# Patient Record
Sex: Male | Born: 1935 | Race: White | Hispanic: No | Smoking: Former smoker
Health system: Southern US, Community
[De-identification: ages and names within clinical notes are randomized; demographics above are authoritative.]

## PROBLEM LIST (undated history)

## (undated) DIAGNOSIS — N185 Chronic kidney disease, stage 5: Secondary | ICD-10-CM

## (undated) DIAGNOSIS — N189 Chronic kidney disease, unspecified: Secondary | ICD-10-CM

## (undated) DIAGNOSIS — D631 Anemia in chronic kidney disease: Secondary | ICD-10-CM

## (undated) DIAGNOSIS — E119 Type 2 diabetes mellitus without complications: Secondary | ICD-10-CM

## (undated) DIAGNOSIS — I10 Essential (primary) hypertension: Secondary | ICD-10-CM

## (undated) HISTORY — PX: APPENDECTOMY: SHX54

## (undated) HISTORY — PX: EYE SURGERY: SHX253

## (undated) HISTORY — PX: TONSILLECTOMY: SUR1361

## (undated) HISTORY — PX: COLON SURGERY: SHX602

## (undated) HISTORY — PX: DIAGNOSTIC LAPAROSCOPY: SUR761

---

## 2012-03-29 ENCOUNTER — Inpatient Hospital Stay (HOSPITAL_COMMUNITY)
Admission: AD | Admit: 2012-03-29 | Discharge: 2012-04-02 | DRG: 690 | Disposition: A | Discharge status: Home / Self Care | Payer: Medicare (Managed Care) | Source: Ambulatory Visit | Attending: Family Medicine | Admitting: Family Medicine

## 2012-03-29 ENCOUNTER — Encounter (HOSPITAL_COMMUNITY): Payer: Self-pay | Admitting: *Deleted

## 2012-03-29 ENCOUNTER — Ambulatory Visit: Payer: Medicare Other

## 2012-03-29 ENCOUNTER — Ambulatory Visit (INDEPENDENT_AMBULATORY_CARE_PROVIDER_SITE_OTHER): Payer: Medicare Other | Admitting: Family Medicine

## 2012-03-29 VITALS — BP 112/78 | HR 67 | Temp 98.4°F | Resp 18 | Ht 68.75 in | Wt 138.0 lb

## 2012-03-29 DIAGNOSIS — R739 Hyperglycemia, unspecified: Secondary | ICD-10-CM | POA: Diagnosis present

## 2012-03-29 DIAGNOSIS — R634 Abnormal weight loss: Secondary | ICD-10-CM

## 2012-03-29 DIAGNOSIS — IMO0002 Reserved for concepts with insufficient information to code with codable children: Secondary | ICD-10-CM

## 2012-03-29 DIAGNOSIS — N189 Chronic kidney disease, unspecified: Secondary | ICD-10-CM

## 2012-03-29 DIAGNOSIS — N400 Enlarged prostate without lower urinary tract symptoms: Secondary | ICD-10-CM | POA: Diagnosis present

## 2012-03-29 DIAGNOSIS — E441 Mild protein-calorie malnutrition: Secondary | ICD-10-CM | POA: Diagnosis present

## 2012-03-29 DIAGNOSIS — N10 Acute tubulo-interstitial nephritis: Principal | ICD-10-CM | POA: Diagnosis present

## 2012-03-29 DIAGNOSIS — E119 Type 2 diabetes mellitus without complications: Secondary | ICD-10-CM | POA: Insufficient documentation

## 2012-03-29 DIAGNOSIS — I120 Hypertensive chronic kidney disease with stage 5 chronic kidney disease or end stage renal disease: Secondary | ICD-10-CM | POA: Diagnosis present

## 2012-03-29 DIAGNOSIS — E440 Moderate protein-calorie malnutrition: Secondary | ICD-10-CM | POA: Diagnosis present

## 2012-03-29 DIAGNOSIS — R630 Anorexia: Secondary | ICD-10-CM | POA: Diagnosis present

## 2012-03-29 DIAGNOSIS — Z23 Encounter for immunization: Secondary | ICD-10-CM

## 2012-03-29 DIAGNOSIS — E860 Dehydration: Secondary | ICD-10-CM | POA: Diagnosis present

## 2012-03-29 DIAGNOSIS — K219 Gastro-esophageal reflux disease without esophagitis: Secondary | ICD-10-CM | POA: Diagnosis present

## 2012-03-29 DIAGNOSIS — Z79899 Other long term (current) drug therapy: Secondary | ICD-10-CM

## 2012-03-29 DIAGNOSIS — N185 Chronic kidney disease, stage 5: Secondary | ICD-10-CM | POA: Diagnosis present

## 2012-03-29 DIAGNOSIS — Z66 Do not resuscitate: Secondary | ICD-10-CM | POA: Diagnosis present

## 2012-03-29 DIAGNOSIS — D631 Anemia in chronic kidney disease: Secondary | ICD-10-CM | POA: Diagnosis present

## 2012-03-29 DIAGNOSIS — B9689 Other specified bacterial agents as the cause of diseases classified elsewhere: Secondary | ICD-10-CM | POA: Diagnosis present

## 2012-03-29 DIAGNOSIS — K5900 Constipation, unspecified: Secondary | ICD-10-CM | POA: Diagnosis present

## 2012-03-29 DIAGNOSIS — R82998 Other abnormal findings in urine: Secondary | ICD-10-CM

## 2012-03-29 DIAGNOSIS — A499 Bacterial infection, unspecified: Secondary | ICD-10-CM

## 2012-03-29 DIAGNOSIS — D72829 Elevated white blood cell count, unspecified: Secondary | ICD-10-CM | POA: Diagnosis present

## 2012-03-29 DIAGNOSIS — D696 Thrombocytopenia, unspecified: Secondary | ICD-10-CM | POA: Diagnosis present

## 2012-03-29 DIAGNOSIS — R8281 Pyuria: Secondary | ICD-10-CM

## 2012-03-29 DIAGNOSIS — N179 Acute kidney failure, unspecified: Secondary | ICD-10-CM | POA: Diagnosis present

## 2012-03-29 HISTORY — DX: Chronic kidney disease, unspecified: N18.9

## 2012-03-29 HISTORY — DX: Chronic kidney disease, stage 5: N18.5

## 2012-03-29 HISTORY — DX: Type 2 diabetes mellitus without complications: E11.9

## 2012-03-29 LAB — GLUCOSE, CAPILLARY: Glucose-Capillary: 540 mg/dL — ABNORMAL HIGH (ref 70–99)

## 2012-03-29 LAB — COMPREHENSIVE METABOLIC PANEL
ALT: 10 U/L (ref 0–53)
ALT: 7 U/L (ref 0–53)
AST: 7 U/L (ref 0–37)
Alkaline Phosphatase: 110 U/L (ref 39–117)
CO2: 20 mEq/L (ref 19–32)
CO2: 22 mEq/L (ref 19–32)
Calcium: 8.6 mg/dL (ref 8.4–10.5)
Chloride: 92 mEq/L — ABNORMAL LOW (ref 96–112)
Creat: 5.26 mg/dL — ABNORMAL HIGH (ref 0.50–1.35)
GFR calc Af Amer: 11 mL/min — ABNORMAL LOW (ref >90)
GFR calc non Af Amer: 9 mL/min — ABNORMAL LOW (ref >90)
Glucose, Bld: 531 mg/dL (ref 70–99)
Glucose, Bld: 486 mg/dL — ABNORMAL HIGH (ref 70–99)
Potassium: 4.4 mEq/L (ref 3.5–5.1)
Sodium: 130 mEq/L — ABNORMAL LOW (ref 135–145)
Total Protein: 6.1 g/dL (ref 6.0–8.3)

## 2012-03-29 LAB — POCT CBC
Lymph, poc: 1.3 (ref 0.6–3.4)
MCH, POC: 29.8 pg (ref 27–31.2)
MCHC: 31.9 g/dL (ref 31.8–35.4)
MID (cbc): 0.8 (ref 0–0.9)
MPV: 9.5 fL (ref 0–99.8)
POC Granulocyte: 23.5 — AB (ref 2–6.9)
POC LYMPH PERCENT: 4.9 %L — AB (ref 10–50)
POC MID %: 3.3 %M (ref 0–12)
RDW, POC: 14.3 %
WBC: 25.6 10*3/uL — AB (ref 4.6–10.2)

## 2012-03-29 LAB — CBC WITH DIFFERENTIAL/PLATELET
HCT: 30.6 % — ABNORMAL LOW (ref 39.0–52.0)
Hemoglobin: 10.5 g/dL — ABNORMAL LOW (ref 13.0–17.0)
Lymphocytes Relative: 3 % — ABNORMAL LOW (ref 12–46)
Lymphs Abs: 0.6 10*3/uL — ABNORMAL LOW (ref 0.7–4.0)
MCHC: 34.3 g/dL (ref 30.0–36.0)
Monocytes Absolute: 1 10*3/uL (ref 0.1–1.0)
Monocytes Relative: 5 % (ref 3–12)
Neutro Abs: 18.3 10*3/uL — ABNORMAL HIGH (ref 1.7–7.7)
Neutrophils Relative %: 92 % — ABNORMAL HIGH (ref 43–77)
RBC: 3.48 MIL/uL — ABNORMAL LOW (ref 4.22–5.81)
WBC: 20 10*3/uL — ABNORMAL HIGH (ref 4.0–10.5)

## 2012-03-29 LAB — POCT UA - MICROSCOPIC ONLY
Mucus, UA: NEGATIVE
Yeast, UA: NEGATIVE

## 2012-03-29 LAB — POCT URINALYSIS DIPSTICK
Bilirubin, UA: NEGATIVE
Glucose, UA: >=1000
Ketones, UA: NEGATIVE
Protein, UA: 30

## 2012-03-29 LAB — TECHNOLOGIST SMEAR REVIEW

## 2012-03-29 LAB — SEDIMENTATION RATE: Sed Rate: 30 mm/hr — ABNORMAL HIGH (ref 0–16)

## 2012-03-29 LAB — TSH: TSH: 2.081 u[IU]/mL (ref 0.350–4.500)

## 2012-03-29 LAB — GLUCOSE, POCT (MANUAL RESULT ENTRY)

## 2012-03-29 MED ORDER — INSULIN ASPART 100 UNIT/ML ~~LOC~~ SOLN
12.0000 [IU] | Freq: Once | SUBCUTANEOUS | Status: AC
  Administered 2012-03-29: 12 [IU] via SUBCUTANEOUS

## 2012-03-29 MED ORDER — BUPROPION HCL ER (SR) 150 MG PO TB12
150.0000 mg | ORAL_TABLET | Freq: Two times a day (BID) | ORAL | Status: DC
  Administered 2012-03-29 – 2012-03-30 (×2): 150 mg via ORAL
  Filled 2012-03-29 (×3): qty 1

## 2012-03-29 MED ORDER — INSULIN ASPART 100 UNIT/ML ~~LOC~~ SOLN
8.0000 [IU] | Freq: Once | SUBCUTANEOUS | Status: AC
  Administered 2012-03-29: 8 [IU] via SUBCUTANEOUS

## 2012-03-29 MED ORDER — HEPARIN SODIUM (PORCINE) 5000 UNIT/ML IJ SOLN
5000.0000 [IU] | Freq: Three times a day (TID) | INTRAMUSCULAR | Status: DC
  Administered 2012-03-30 – 2012-04-01 (×8): 5000 [IU] via SUBCUTANEOUS
  Filled 2012-03-29 (×14): qty 1

## 2012-03-29 MED ORDER — METOPROLOL TARTRATE 25 MG PO TABS
25.0000 mg | ORAL_TABLET | Freq: Two times a day (BID) | ORAL | Status: DC
  Administered 2012-03-29: 25 mg via ORAL
  Filled 2012-03-29 (×3): qty 1

## 2012-03-29 MED ORDER — PANTOPRAZOLE SODIUM 40 MG PO TBEC
40.0000 mg | DELAYED_RELEASE_TABLET | Freq: Every day | ORAL | Status: DC
  Administered 2012-03-30 – 2012-04-02 (×4): 40 mg via ORAL
  Filled 2012-03-29 (×4): qty 1

## 2012-03-29 MED ORDER — DOXAZOSIN MESYLATE 2 MG PO TABS
2.0000 mg | ORAL_TABLET | Freq: Every day | ORAL | Status: DC
  Administered 2012-03-29: 2 mg via ORAL
  Filled 2012-03-29 (×2): qty 1

## 2012-03-29 MED ORDER — ASPIRIN 81 MG PO CHEW
81.0000 mg | CHEWABLE_TABLET | Freq: Every day | ORAL | Status: DC
  Administered 2012-03-29 – 2012-03-31 (×3): 81 mg via ORAL
  Filled 2012-03-29 (×4): qty 1

## 2012-03-29 MED ORDER — SIMVASTATIN 10 MG PO TABS
10.0000 mg | ORAL_TABLET | Freq: Every day | ORAL | Status: DC
  Administered 2012-03-29 – 2012-03-31 (×2): 10 mg via ORAL
  Filled 2012-03-29 (×4): qty 1

## 2012-03-29 MED ORDER — INSULIN ASPART 100 UNIT/ML ~~LOC~~ SOLN
0.0000 [IU] | Freq: Three times a day (TID) | SUBCUTANEOUS | Status: DC
  Administered 2012-03-30: 2 [IU] via SUBCUTANEOUS
  Administered 2012-03-30: 9 [IU] via SUBCUTANEOUS
  Administered 2012-03-30: 5 [IU] via SUBCUTANEOUS

## 2012-03-29 MED ORDER — INFLUENZA VIRUS VACC SPLIT PF IM SUSP
0.5000 mL | INTRAMUSCULAR | Status: AC
  Administered 2012-03-31: 0.5 mL via INTRAMUSCULAR
  Filled 2012-03-29: qty 0.5

## 2012-03-29 NOTE — Progress Notes (Signed)
[facility] [address], [place] [address] [phone]  Date:  03/29/2012   Name:  Mario Turner   DOB:  [birth year]   MRN:  [id number]  PCP:  No primary provider on file.    Chief Complaint: Fatigue and no appetite   History of Present Illness:  Mario Turner is a 78 y.o. very pleasant male patient who presents with the following:  Here today with his son.  He lives in [place] full- time, but has been traveling- driving across the [place]-  for the last 5 weeks.  Some of his friends called his son recently with their concerns that Mario Turner is weak, sleeping a lot and not eating much.  His son went out to pick him up from [place] because he did not feel like driving any further.  He just arrived in [place] yesterday.  He has been feeling poorly for about one month, even worse for about 2 weeks.  He has fallen a couple of times about a week ago -this is not typical for him.  He did not have LOC.  He banged his right knee the last time he fell but was not serious injured.    He has been told that he has kidney failure- about 5 years ago he was told that he needed dialysis.  He thinks that his BUN might be around 25 on last check but he is not really sure. His PCP is in [place].  He also has DM- no insulin.  Review of his glucose shows that he normally runs between 75 and 150.  He does not usually get over 200.      He does have a history of high potassium as well.    He does not note any other particular symptoms such as fever, chills, diarrhea, or vomiting.  He does not have any localized pain. He does not feel like eating much.  He does not have a taste for food. "If I never eat again that is ok."  He has lost 5- 10 lbs.    He has not noted any CP or SOB.  Demarquez has noted some constipation- he goes about once a week as of late.  He does not seem to have any urinary symptoms except that "sometimew when I go there isn't anything there."    There is no problem list  on file for this patient.   No past medical history on file.  No past surgical history on file.  History  Substance Use Topics  . Smoking status: Former Smoker    Quit date: 06/23/1978  . Smokeless tobacco: Not on file  . Alcohol Use: Not on file    No family history on file.  Not on File  Medication list has been reviewed and updated.  Current Outpatient Prescriptions on File Prior to Visit  Medication Sig Dispense Refill  . buPROPion (BUDEPRION SR) 150 MG 12 hr tablet Take 150 mg by mouth 2 (two) times daily.      Marland Kitchen doxazosin (CARDURA) 2 MG tablet Take 2 mg by mouth at bedtime.      Marland Kitchen glipiZIDE (GLUCOTROL) 10 MG tablet Take 10 mg by mouth 2 (two) times daily before a meal. 2 in the morning and 1 in the evening      . lisinopril (PRINIVIL,ZESTRIL) 10 MG tablet Take 10 mg by mouth daily.      . metoprolol tartrate (LOPRESSOR) 25 MG tablet Take 25 mg by mouth 2 (  two) times daily.      . simvastatin (ZOCOR) 10 MG tablet Take 10 mg by mouth at bedtime.      . sitaGLIPtin (JANUVIA) 25 MG tablet Take 25 mg by mouth daily.        Review of Systems:  As per HPI- otherwise negative.   Physical Examination: Filed Vitals:   03/29/12 1441  BP: 112/78  Pulse: 67  Temp: 98.4 F (36.9 C)  Resp: 18   Filed Vitals:   03/29/12 1441  Height: 5' 8.75" (1.746 m)  Weight: 138 lb (62.596 kg)   Body mass index is 20.53 kg/(m^2). Ideal Body Weight: Weight in (lb) to have BMI = 25: 167.7   GEN: WDWN, NAD, Non-toxic, A & O x 3, looks well but frail HEENT: Atraumatic, Normocephalic. Neck supple. No masses, No LAD.  Bilateral TM wnl, oropharynx normal.  PEERL,EOMI.   Ears and Nose: No external deformity. CV: RRR, No M/G/R. No JVD. No thrill. No extra heart sounds. PULM: CTA B, no wheezes, crackles, rhonchi. No retractions. No resp. distress. No accessory muscle use. ABD: S, NT, ND, +BS. No rebound. No HSM. Abdomen is benign.   EXTR: No c/c/e NEURO Normal gait.  PSYCH: Normally  interactive. Conversant. Not depressed or anxious appearing.  Calm demeanor.   [facility] reading (PRIMARY) by  Dr. Patsy Lager.  CXR: negative, degenerative change in the spine Abdominal series: increased bowel gas, no obstruction  ACUTE ABDOMEN SERIES (ABDOMEN 2 VIEW & CHEST 1 VIEW)  Comparison: None.  Findings: Mildly prominent loops of small bowel on the supine view, but no air-fluid levels on the erect view. This is nonspecific. It may reflect a minor adynamic ileus, or even be a normal finding in this patient. There is no evidence of obstruction and no free air.  The abdominal soft tissues are unremarkable.  The included chest radiograph demonstrates he healed granuloma in the upper lobes but otherwise clear lungs and in unremarkable heart, mediastinum and hila.  The bones are demineralized and there are degenerative changes most evident of the lumbar spine. No osteoblastic or osteolytic lesions.  IMPRESSION: No obstruction or free air. No active disease of the chest radiograph.  CHEST - 2 VIEW  Comparison: None.  Findings: Lungs are hyperexpanded. No edema or focal airspace consolidation. Small nodular densities are seen in the right mid lung and in the left apex. Given the conspicuity relative to their tiny size, they are most likely mineralized and represent calcified granulomata. The cardiopericardial silhouette is within normal limits for size. Imaged bony structures of the thorax are intact.  IMPRESSION: No acute cardiopulmonary process.   Results for orders placed in visit on 03/29/12  POCT CBC      Component Value Range   WBC 25.6 (*) 4.6 - 10.2 K/uL   Lymph, poc 1.3  0.6 - 3.4   POC LYMPH PERCENT 4.9 (*) 10 - 50 %L   MID (cbc) 0.8  0 - 0.9   POC MID % 3.3  0 - 12 %M   POC Granulocyte 23.5 (*) 2 - 6.9   Granulocyte percent 91.8 (*) 37 - 80 %G   RBC 3.92 (*) 4.69 - 6.13 M/uL   Hemoglobin 11.7 (*) 14.1 - 18.1 g/dL   HCT, POC 40.9 (*) 81.1 - 53.7 %   MCV 93.6   80 - 97 fL   MCH, POC 29.8  27 - 31.2 pg   MCHC 31.9  31.8 - 35.4 g/dL   RDW, POC 14.3  Platelet Count, POC 620 (*) 142 - 424 K/uL   MPV 9.5  0 - 99.8 fL  GLUCOSE, POCT (MANUAL RESULT ENTRY)      Component Value Range   POC Glucose HHH  70 - 99 mg/dl  POCT GLYCOSYLATED HEMOGLOBIN (HGB A1C)      Component Value Range   Hemoglobin A1C 8.6    POCT URINALYSIS DIPSTICK      Component Value Range   Color, UA yellow     Clarity, UA cloudy     Glucose, UA >=1000     Bilirubin, UA mneg     Ketones, UA neg     Spec Grav, UA <=1.005     Blood, UA large     pH, UA 5.0     Protein, UA 30     Urobilinogen, UA 0.2     Nitrite, UA positive     Leukocytes, UA small (1+)    POCT UA - MICROSCOPIC ONLY      Component Value Range   WBC, Ur, HPF, POC 20-25     RBC, urine, microscopic 3-5     Bacteria, U Microscopic trace     Mucus, UA neg     Epithelial cells, urine per micros 0-1     Crystals, Ur, HPF, POC neg     Casts, Ur, LPF, POC neg     Yeast, UA neg      Assessment and Plan: 1. Anorexia  Comprehensive metabolic panel  2. Diabetes mellitus, type II  POCT glucose (manual entry), POCT glycosylated hemoglobin (Hb A1C), Comprehensive metabolic panel  3. CRF (chronic renal failure)  Comprehensive metabolic panel  4. Weight loss  POCT CBC, POCT urinalysis dipstick, POCT UA - Microscopic Only, DG Abd Acute W/Chest, DG Chest 2 View, Comprehensive metabolic panel, TSH  5. Pyuria  Urine culture   Mario Turner may have pyelonephritis.  However, I am not certain that his urine is his infection source. We have sent a urine culture.  I canceled the above CMP.  We are not able to read his glucose- it is unusual for his sugar to be so high.   Mario Turner has several concerning issues on his labs today, and I am not able to check a CMET or calculate his gap here today.  He needs hospital admission for further evaluation and treatment.  Discussed with FP inpatient team who agreed to admit this nice gentleman.   We appreciate their care of Mr. Crookshanks.  He will proceed to admitting with his son.    Abbe Amsterdam, MD

## 2012-03-29 NOTE — H&P (Signed)
Family Medicine Teaching [facility] Admission History and Physical Service Pager: [phone]  Patient name: Mario Turner Medical record number: [id number] Date of birth: [birth year] Age: 78 y.o. Gender: male  Primary Care Provider: [facility] x 1 visit, [place]  Chief Complaint: fatigue, sleepiness  Assessment and Plan: Mario Turner is a 78 y.o. year old male  Presenting as direct afmit from [place] with fatigue, sleepiness and found to have wbc >25k and hyperglycemia  1. Leukocytosis-unclear etiology, concern of relation to malignancy or dysplastic process (obtain peripheral smear), could be related to DKA but no ketones in urine and pending cmet, could be secondary to infection - negative CXR, possible UTI, no other clues on history or exam.  1.  pending cbc with diff, LDH, ESR 2. Will not empirically treat with antibiotics given clinical stability and lack of fevers 1. Follow urine culture 2. Consider blood cultures if febrile 2. Hyperglycemia-once again uncertain etiology, patient exogenous  insulin naive and takes glipizide and [place]. Could be related to noncompliance but reportedly does not miss many doses.  1. Will hold orals inpatient and place on sliding scale 2. 12 units now and repeat in 2 hours for cbg of 540 3. CMET and calculate GAP 3. Weight loss/fatigue/sleepiness-will check AM cortisol (possible suppressed adrenal axis) and TSH in addition to above testing. Possible #4 contributing.  4. Anemia-thrombocytosis as well, could be acute phase reactant. Will check FOBT. Normal hgb makes an acute leukemia less likely as does elevated platelets.  5. CKD history-will hold on sodium bicarb for now, could effect anion gap readings 6. CV (HTN, HLD)-continue aspirin, beta blocker. Hold lisinopril while awaiting CMET. Continue statin 7. GERD-continue PPI, well controlled 8. BPH-continue doxazosin 9. Depression-cotinue bupropion 10.  11. FEN/GI: carb modified,  check cmet 12. Prophylaxis: heparin SQ 13. Disposition: observation, pendin further workup, consider PT/OT 14. Code Status: dnr/dni/no dialysis  History of Present Illness: Mario Turner is a 78 y.o. year old male presenting with increasing  fatigue, sleepiness over last 2 months.   Patient on road trip/vacationing for last 1.5 months and has noted progressive low appetite as "food just didn't taste good". Patient has lost 7 lbs in month and a half. No diarrhea but in fact he has had bad constipation (coudlnt go for 2 weeks and now just goes every other week). Does report having colonoscopy in last 10 years. He and his son who is present with him also describe more confusion during this time. He will forget some things such as taking his medicine but he remains fully oriented and able to do most tasks.   The patient describes that his wife died 3 years ago and "came back" and "told him how good things were going". Wife told him to go help family friend in [place] while on his road trip who ended up several weeks later having a son with MS with multiple problems who needed help. Son comments that this story has not changed for his father and that his dad has repeated story very similarly with slight variation from time to time. When patient had a lot of things to do with his friend whose son had MS, he felt somewhat energized but when he moved on to visit another friend, he noticed that as he was less busy, his tiredness and weakness progressed. Most recently within the last 2 weeks he has been visiting friends in [place] and has had to use steps to go to his room. This has  been very taxing on him more than expected.  He has had 2 falls out of a bathtub and people he was staying with called patient's son saying very sleepy and eating minimal amounts. When fell, did not lose consciousness or hit head, only hit knee. Went to [facility] today and found to have wbc >25k and hyperglycemia so sent to  [facility] as direct admit.   ROS shows -Urinating more frequently for last 6 weeks. Has not checked CBGs since 9/20. No dysuria. Denies URI symptoms, cough, sob, sore throat, chest pain, lymphadenopathy, fevers, chills, night sweats, nausea, vomiting, diarrhea, abdominal pain, rash, sores, ear pain.   Patient made sure during conversation to note that he has a living will-body donated for surgical in [facility] [place] to test new surgical equipment. DNR/DNI. No dialysis.   Patient Active Problem List  Diagnosis  . Diabetes mellitus, type II  . CRF (chronic renal failure)  HLD HTN Poor kidney function would not desire dialysis. -was told   Past Medical History: No past medical history on file. Past Surgical History: Appendectomy, rectal surgery for unknown reasons  Social History: History  Substance Use Topics  . Smoking status: Former Smoker    Quit date: 06/23/1978  . Smokeless tobacco: Not on file  . Alcohol Use: Not on file   For any additional social history documentation, please refer to relevant sections of EMR.  Family History: No family history on file. Parents in great health with exception of dementai Allergies: No Known Allergies No current facility-administered medications on file prior to encounter.   Current Outpatient Prescriptions on File Prior to Encounter  Medication Sig Dispense Refill  . buPROPion (BUDEPRION SR) 150 MG 12 hr tablet Take 150 mg by mouth 2 (two) times daily.      Marland Kitchen doxazosin (CARDURA) 2 MG tablet Take 2 mg by mouth at bedtime.      Marland Kitchen glipiZIDE (GLUCOTROL) 10 MG tablet Take 10 mg by mouth 2 (two) times daily before a meal. 2 in the morning and 1 in the evening      . lisinopril (PRINIVIL,ZESTRIL) 10 MG tablet Take 10 mg by mouth daily.      . metoprolol tartrate (LOPRESSOR) 25 MG tablet Take 25 mg by mouth 2 (two) times daily.      . simvastatin (ZOCOR) 10 MG tablet Take 10 mg by mouth at bedtime.      . sitaGLIPtin (JANUVIA) 25 MG tablet Take 25  mg by mouth daily.      . sodium bicarbonate 325 MG tablet Take 150 mg by mouth 2 (two) times daily.       Review Of Systems: Per HPI  Otherwise 12 point review of systems was performed and was unremarkable.  Physical Exam: BP 115/81  Pulse 71  Temp 98.2 F (36.8 C) (Oral)  Resp 18  Ht 5' 8.9" (1.75 m)  Wt 138 lb 0.1 oz (62.6 kg)  BMI 20.44 kg/m2  SpO2 99% Gen: NAD, resting comfortably in bed, very thin male, no difficulty getting out of bed and walking to bathroom HEENT: NCAT,  Slightly dry mucus membranes, PERRLA  CV: RRR no mrg  Lungs: CTAB  Abd: soft/nondistended/normal bowel sounds. Liver edge felt 2 cm below costal margin. No splenomegaly. Mildly tender to deep palpation in all quadrants.  MSK: moves all extremities, no edema  Skin: warm and dry, no rash  Lymph nodes: no axillary, inguinal, cervical lymphadenopathy Neuro: alert and oriented x 3,  sensation and reflexes normal  throughout, 5/5 muscle strength in bilateral upper and lower extremities.    Labs and Imaging: Results for orders placed during the hospital encounter of 03/29/12 (from the past 48 hour(s))  GLUCOSE, CAPILLARY     Status: Abnormal   Collection Time   03/29/12  7:27 PM      Component Value Range Comment   Glucose-Capillary 540 (*) 70 - 99 mg/dL    Comment 1 Call MD NNP PA CNM      Other labs pending.   Outpatient POC cbc with wbc 25.6, hgb 11.7, plat 620 a1c 8.6 but CBG too high to count UA >1000 glucose, <1.005 spec grav, negative ketones, nitrite positive, small leukocytes, 20-25 wbc, 3-5 rbc, trace bacteria, few epithelial cells  Dg Chest 2 View  03/29/2012  *RADIOLOGY REPORT*  Clinical Data: Chest pain and shortness of breath.  CHEST - 2 VIEW  Comparison: None.  Findings: Lungs are hyperexpanded.  No edema or focal airspace consolidation.  Small nodular densities are seen in the right mid lung and in the left apex.  Given the conspicuity relative to their tiny size, they are most likely  mineralized and represent calcified granulomata. The cardiopericardial silhouette is within normal limits for size. Imaged bony structures of the thorax are intact.  IMPRESSION: No acute cardiopulmonary process.   Original Report Authenticated By: ERIC A. MANSELL, M.D.    Dg Abd Acute W/chest  03/29/2012  *RADIOLOGY REPORT*  Clinical Data: Abdominal discomfort.  Weight loss.  ACUTE ABDOMEN SERIES (ABDOMEN 2 VIEW & CHEST 1 VIEW)  Comparison: None.  Findings: Mildly prominent loops of small bowel on the supine view, but no air-fluid levels on the erect view.  This is nonspecific. It may reflect a minor adynamic ileus, or even be a normal finding in this patient.  There is no evidence of obstruction and no free air.  The abdominal soft tissues are unremarkable.  The included chest radiograph demonstrates he healed granuloma in the upper lobes but otherwise clear lungs and in unremarkable heart, mediastinum and hila.  The bones are demineralized and there are degenerative changes most evident of the lumbar spine.  No osteoblastic or osteolytic lesions.  IMPRESSION: No obstruction or free air.  No active disease of the chest radiograph.   Original Report Authenticated By: Domenic Moras, M.D.    Tana Conch, MD, PGY2 03/29/2012 8:47 PM

## 2012-03-29 NOTE — Patient Instructions (Addendum)
Please go to admitting at [facility]- you are going to be admitted to the [facility] service.  You do not have to go to the Emergency Room

## 2012-03-30 ENCOUNTER — Observation Stay (HOSPITAL_COMMUNITY): Payer: Medicare (Managed Care)

## 2012-03-30 ENCOUNTER — Encounter: Payer: Self-pay | Admitting: Family Medicine

## 2012-03-30 DIAGNOSIS — N10 Acute tubulo-interstitial nephritis: Secondary | ICD-10-CM

## 2012-03-30 DIAGNOSIS — N186 End stage renal disease: Secondary | ICD-10-CM

## 2012-03-30 DIAGNOSIS — N179 Acute kidney failure, unspecified: Secondary | ICD-10-CM

## 2012-03-30 LAB — COMPREHENSIVE METABOLIC PANEL
ALT: 8 U/L (ref 0–53)
CO2: 23 mEq/L (ref 19–32)
Calcium: 8.3 mg/dL — ABNORMAL LOW (ref 8.4–10.5)
Chloride: 98 mEq/L (ref 96–112)
GFR calc Af Amer: 11 mL/min — ABNORMAL LOW (ref >90)
GFR calc non Af Amer: 10 mL/min — ABNORMAL LOW (ref >90)
Glucose, Bld: 186 mg/dL — ABNORMAL HIGH (ref 70–99)
Sodium: 131 mEq/L — ABNORMAL LOW (ref 135–145)
Total Bilirubin: 0.3 mg/dL (ref 0.3–1.2)

## 2012-03-30 LAB — BASIC METABOLIC PANEL
BUN: 88 mg/dL — ABNORMAL HIGH (ref 6–23)
Creatinine, Ser: 5.02 mg/dL — ABNORMAL HIGH (ref 0.50–1.35)
GFR calc Af Amer: 11 mL/min — ABNORMAL LOW (ref >90)
GFR calc non Af Amer: 10 mL/min — ABNORMAL LOW (ref >90)
Glucose, Bld: 428 mg/dL — ABNORMAL HIGH (ref 70–99)
Potassium: 4.5 mEq/L (ref 3.5–5.1)

## 2012-03-30 LAB — RETICULOCYTES
RBC.: 3.09 MIL/uL — ABNORMAL LOW (ref 4.22–5.81)
Retic Count, Absolute: 27.8 10*3/uL (ref 19.0–186.0)
Retic Ct Pct: 0.9 % (ref 0.4–3.1)

## 2012-03-30 LAB — CBC
Hemoglobin: 9.5 g/dL — ABNORMAL LOW (ref 13.0–17.0)
MCH: 30.4 pg (ref 26.0–34.0)
MCHC: 34.7 g/dL (ref 30.0–36.0)
Platelets: 364 10*3/uL (ref 150–400)
RDW: 13.2 % (ref 11.5–15.5)

## 2012-03-30 LAB — GLUCOSE, CAPILLARY

## 2012-03-30 LAB — CORTISOL-AM, BLOOD: Cortisol - AM: 22.4 ug/dL (ref 4.3–22.4)

## 2012-03-30 MED ORDER — SODIUM CHLORIDE 0.9 % IV SOLN
INTRAVENOUS | Status: DC
  Administered 2012-03-30 (×2): via INTRAVENOUS
  Administered 2012-03-31: 20 mL via INTRAVENOUS
  Administered 2012-04-01: 1000 mL via INTRAVENOUS
  Administered 2012-04-02: 04:00:00 via INTRAVENOUS

## 2012-03-30 MED ORDER — GLUCERNA SHAKE PO LIQD
237.0000 mL | Freq: Three times a day (TID) | ORAL | Status: DC
  Administered 2012-03-31 – 2012-04-02 (×7): 237 mL via ORAL

## 2012-03-30 NOTE — Progress Notes (Signed)
Inpatient Diabetes Program Recommendations  AACE/ADA: New Consensus Statement on Inpatient Glycemic Control (2013)  Target Ranges:  Prepandial:   less than 140 mg/dL      Peak postprandial:   less than 180 mg/dL (1-2 hours)      Critically ill patients:  140 - 180 mg/dL   Reason for Visit: Results for Mario Turner, Mario Turner (MRN [id number]) as of 03/30/2012 12:57  Ref. Range 03/29/2012 22:05 03/30/2012 02:40 03/30/2012 07:35 03/30/2012 11:49  Glucose-Capillary Latest Range: 70-99 mg/dL 308 (H) 657 (H) 846 (H) 270 (H)   Patient admitted with CBG's greater than 500 mg/dL.  CBG's better this morning.  A1C was 8.6% however creatinine elevated.  Consider adding Lantus 8 units daily.  Will follow.

## 2012-03-30 NOTE — Progress Notes (Signed)
Family Medicine Teaching Service                                                                 [facility] Progress Note     Patient name: Mario Turner Medical record number: [id number] Date of birth: 10/31/1927 Age: 78 y.o. Gender: male    LOS: 1 day   Primary Care Provider: No primary provider on file.  Overnight Events: No acute events overnight. Patient continues to endorse fatigue and anorexia.  Objective: Vital signs in last 24 hours: BP 93/50  Pulse 60  Temp 97.4 F (36.3 C) (Oral)  Resp 18  Ht 5' 8.9" (1.75 m)  Wt 138 lb 0.1 oz (62.6 kg)  BMI 20.44 kg/m2  SpO2 96%    Physical Exam: Gen: Elderly man sitting in bed in NAD HEENT:  EOMI.  CV: RRR. No murmurs. Res: Clear breath sounds. No rales. No wheezes. Abd: Soft. Non tender. Non distended. No suprapubic tenderness. Bowel sounds present. Ext/Musc: No edema Neuro:Oriented to person, place, and time. No focal motor or sensory deficits. Immediate recall intact. Unable to do delayed recall. Abnormal clock draw.   Labs/Studies: Chem: Na 131  K 4.3  Cl 98  CO2 23  BUN 89  Creatinine 5.05  Calcium 8.3  Glucose 186 CBC: WBC 15.3  Hgb 9.5  Hct 27.4  Platelets 364  Alk phos 85  Albumin 2.4  AST 7 ALT 8  Total protein 5.4  Sed rate: 30 TSH 1.836  Medications: Scheduled Meds:   . aspirin  81 mg Oral Daily  . buPROPion  150 mg Oral BID  . doxazosin  2 mg Oral QHS  . heparin  5,000 Units Subcutaneous Q8H  . influenza  inactive virus vaccine  0.5 mL Intramuscular Tomorrow-1000  . insulin aspart  0-9 Units Subcutaneous TID WC  . insulin aspart  12 Units Subcutaneous Once  . insulin aspart  8 Units Subcutaneous Once  . metoprolol tartrate  25 mg Oral BID  . pantoprazole  40 mg Oral Q1200  . simvastatin  10 mg Oral QHS   Continuous Infusions:   . sodium chloride 75 mL/hr at 03/30/12 0748   PRN Meds:.     Assessment/Plan:  78yo M w/ HTN, HLD, CKD (stage  unknown) and DM2 presents with constipation, anorexia, weight loss, and anemia.    1. Weight loss/constipation/fatigue/anorexia: These symptoms are concerning for some kind of malignancy, possibly GI. Patient states that he had a colonoscopy "many years ago" but does not recall what the results of the study were.  -- Consult GI for possible colonoscopy  -- consider abdominal CT. However, the patient may not be able to tolerate the contrast required.   2. Leukocytosis: Etiology is unclear. Could be due to an underlying malignancy. Overnight WBC dropped from 20.0 to 15.3. Peripheral smear was unremarkable. CBC diff showed neutrophil predominance and ESR was elevated at 30. LDH was wnl. Patient has remained afebrile.  -- will not treat with antibiotics -- follow urine and blood cultures   3. CKD: Stage is unclear. Patient states he was told he needed dialysis in the past but has refused. The patient's creatinine is elevated at 5.05 this morning. -- renal ultrasound -- consult renal for recs  4. Anemia: Overnight the  patient's hemoglobin has trended down from 11.7 -> 10.5 -> 9.5.  Patient denies any blood in the stool and is not coughing up any blood. Possible causes include iron deficiency anemia, GI bleed, and anemia of chronic disease.The downward trend of his hemoglobin is concerning and makes some kind of GI bleed more likely.  -- iron studies  5. Hyperglycemia: Patient has a history of DM2 and takes glipizide and [place]. Patient did have an anion gap of 19 on admission. However this morning his anion gap was 11.   -- SSI  6. CV - patient has HLD and HTN -- Continue beta blocker, ASA, and statin  -- hold lisinopril in setting of elevated creatinine  7. GERD -- Continue PPI  8. BPH-continue doxazosin   9. Depression-cotinue bupropion  10. DVT PPx: heparin SQ   11. Dispo: pending further workup  12. Code Status: dnr/dni/no dialysis     [name]    MS IV            Patient seen and examined independently.  Available data reviewed.  Agree with findings, assessment, and plan as outlined by [name], MS IV.  My additional findings are below.  S: No acute events overnight.    O: Gen: sitting in bed in NAD, finished breakfast without difficulty HEENT:  EOMI.  CV: RRR. No murmurs. Res: Clear breath sounds. No rales. No wheezes. Abd: Soft. Non tender. Non distended. No suprapubic tenderness. Bowel sounds present. Ext/Musc: No edema  A/P:  Agree with above.  Will check post-void residual, ultrasound abdomen to rule out obstruction or masses.  Will perform prostate exam to rule prostatitis/infection.  Will perform MMSE and discuss social situation/dispo planning with patient and family today.  [employer], DO 03/30/2012 1:00 PM

## 2012-03-30 NOTE — H&P (Signed)
[facility] Attending Admission Note: Mario Levy MD [phone] pager office [phone] I  have seen and examined this patient, reviewed their chart. I have discussed this patient with the resident. I agree with the resident's findings, assessment and care plan. I have asked the inpatient team to perform rectal exam.  My discussion with Mario Turner this morning makes me think he believes that he is terminally ill. He told me a lot about his plans for his body after death. We discussed his wife's death (suicide after long illness).  He has multiple issues. I think given his weight loss, decreased appetite and significant bowel changes (along with anemia), I would pursue a colonoscopy as initial further diagnostic tests. A CT chest abdomen pelvis might ordinarily bheinpatient attending, dr. McDiarmid this morning.

## 2012-03-30 NOTE — Progress Notes (Signed)
Post void residual 28ml.

## 2012-03-30 NOTE — Evaluation (Signed)
Physical Therapy Evaluation Patient Details Name: Mario Turner MRN: [id number] DOB: 1928-04-21 Today's Date: 03/30/2012 Time: 4098-1191 PT Time Calculation (min): 26 min  PT Assessment / Plan / Recommendation Clinical Impression  Pt admitted with fatigue, hyperglycemia, and body aches. Pt with generally good mobility overall and recommend RW and OPPT for balance training. Will follow acutely to maximize mobility, balance and function to increase independence prior to discharge.     PT Assessment  Patient needs continued PT services    Follow Up Recommendations  Outpatient PT    Does the patient have the potential to tolerate intense rehabilitation      Barriers to Discharge None      Equipment Recommendations  Rolling walker with 5" wheels;Tub/shower seat (son states he can borrow from church)    Recommendations for Other Services     Frequency Min 3X/week    Precautions / Restrictions Precautions Precautions: Fall   Pertinent Vitals/Pain No pain      Mobility  Bed Mobility Bed Mobility: Supine to Sit;Sitting - Scoot to Edge of Bed Supine to Sit: 6: Modified independent (Device/Increase time);HOB flat Sitting - Scoot to Edge of Bed: 6: Modified independent (Device/Increase time) Transfers Transfers: Sit to Stand;Stand to Sit;Stand Pivot Transfers Sit to Stand: 7: Independent;From chair/3-in-1;From bed Stand to Sit: 7: Independent;To bed;To chair/3-in-1 Stand Pivot Transfers: 7: Independent Ambulation/Gait Ambulation/Gait Assistance: 6: Modified independent (Device/Increase time) Ambulation Distance (Feet): 350 Feet Assistive device: None Gait Pattern: Step-through pattern;Decreased stride length Gait velocity: 30/16sec=1.875 ft/sec placing pt at recurrent fall risk Stairs: Yes Stairs Assistance: 6: Modified independent (Device/Increase time) Stair Management Technique: One rail Right Number of Stairs: 4     Shoulder Instructions     Exercises     PT  Diagnosis: Difficulty walking  PT Problem List: Decreased balance;Decreased mobility;Decreased activity tolerance;Decreased knowledge of use of DME PT Treatment Interventions: Gait training;Balance training;Therapeutic activities;Functional mobility training;Patient/family education;DME instruction   PT Goals Acute Rehab PT Goals PT Goal Formulation: With patient Time For Goal Achievement: 04/06/12 Potential to Achieve Goals: Good Pt will Ambulate: >150 feet;with modified independence;with least restrictive assistive device PT Goal: Ambulate - Progress: Goal set today Additional Goals Additional Goal #1: Pt will score >50 on Berg to decrease fall risk. PT Goal: Additional Goal #1 - Progress: Goal set today  Visit Information  Last PT Received On: 03/30/12 Assistance Needed: +1    Subjective Data  Subjective: I just finished traveling all over the country Patient Stated Goal: return to [place] for my last days   Prior Functioning  Home Living Lives With: Alone;Son (typically lives alone in [place], with friends the last few ) Available Help at Discharge: Family;Available 24 hours/day Type of Home: House Home Access: Stairs to enter [employer] of Steps: 4 Entrance Stairs-Rails: Right Home Layout: One level Bathroom Shower/Tub: Health visitor: Standard Home Adaptive Equipment: None Additional Comments: Son is a Education officer, environmental and states they can borrow all necessary equipment from church. Pt has been traveling and staying with friends the last few months but son lives in [place] and plans to initially discharge there prior to return to his home in [place]. Prior Function Level of Independence: Independent Able to Take Stairs?: Yes Driving: Yes Vocation: Retired Musician: Clinical cytogeneticist  Overall Cognitive Status: Appears within functional limits for tasks assessed/performed Arousal/Alertness: Awake/alert Orientation Level: Appears intact  for tasks assessed Behavior During Session: [facility] for tasks performed    Extremity/Trunk Assessment Right Lower Extremity Assessment RLE ROM/Strength/Tone:  WFL for tasks assessed Left Lower Extremity Assessment LLE ROM/Strength/Tone: [facility] for tasks assessed Trunk Assessment Trunk Assessment: Normal   Balance Standardized Balance Assessment Standardized Balance Assessment: Berg Balance Test Berg Balance Test Sit to Stand: Able to stand without using hands and stabilize independently Standing Unsupported: Able to stand safely 2 minutes Sitting with Back Unsupported but Feet Supported on Floor or Stool: Able to sit safely and securely 2 minutes Stand to Sit: Sits safely with minimal use of hands Transfers: Able to transfer safely, minor use of hands Standing Unsupported with Eyes Closed: Able to stand 10 seconds with supervision Standing Ubsupported with Feet Together: Able to place feet together independently and stand for 1 minute with supervision From Standing, Reach Forward with Outstretched Arm: Can reach confidently >25 cm (10") From Standing Position, Pick up Object from Floor: Able to pick up shoe safely and easily From Standing Position, Turn to Look Behind Over each Shoulder: Looks behind one side only/other side shows less weight shift Turn 360 Degrees: Able to turn 360 degrees safely one side only in 4 seconds or less Standing Unsupported, Alternately Place Feet on Step/Stool: Able to complete >2 steps/needs minimal assist Standing Unsupported, One Foot in Front: Needs help to step but can hold 15 seconds Standing on One Leg: Unable to try or needs assist to prevent fall Total Score: 42   End of Session PT - End of Session Equipment Utilized During Treatment: Gait belt Activity Tolerance: Patient tolerated treatment well Patient left: in chair;with call bell/phone within reach;with family/visitor present Nurse Communication: Mobility status  GP     Delorse Lek 03/30/2012, 3:35 PM  Delaney Meigs, PT [phone]

## 2012-03-30 NOTE — Progress Notes (Signed)
MMSE performed on patient. Scored 27/30. Had difficulty with serial 7s.   Prostate Exam: enlarged prostate. No nodules. No tenderness on palpation.

## 2012-03-30 NOTE — Progress Notes (Signed)
INITIAL ADULT NUTRITION ASSESSMENT Date: 03/30/2012   Time: 10:02 AM Reason for Assessment: MST (Malnutrition Screening Tool)   INTERVENTION: 1. Glucerna Shake po TID, each supplement provides 220 kcal and 10 grams of protein. 2. RD will continue to follow    DOCUMENTATION CODES Per approved criteria  -Severe malnutrition in the context of chronic illness    ASSESSMENT: Male 78 y.o.  Dx: Fatigue  Hx:  Past Medical History  Diagnosis Date  . Diabetes mellitus without complication   . Chronic kidney disease     Past Surgical History  Procedure Date  . Appendectomy   . Eye surgery   . Tonsillectomy   . Colon surgery   . Diagnostic laparoscopy     Related Meds:     . aspirin  81 mg Oral Daily  . buPROPion  150 mg Oral BID  . doxazosin  2 mg Oral QHS  . heparin  5,000 Units Subcutaneous Q8H  . influenza  inactive virus vaccine  0.5 mL Intramuscular Tomorrow-1000  . insulin aspart  0-9 Units Subcutaneous TID WC  . insulin aspart  12 Units Subcutaneous Once  . insulin aspart  8 Units Subcutaneous Once  . metoprolol tartrate  25 mg Oral BID  . pantoprazole  40 mg Oral Q1200  . simvastatin  10 mg Oral QHS     Ht: 5' 8.9" (175 cm)  Wt: 138 lb 0.1 oz (62.6 kg)  Ideal Wt: 72.5 kg  % Ideal Wt: 86%  Usual Wt: 150 lbs Wt Readings from Last 5 Encounters:  03/29/12 138 lb 0.1 oz (62.6 kg)  03/29/12 138 lb (62.596 kg)    % Usual Wt: 92%  Body mass index is 20.44 kg/(m^2). Pt is WNL per current BMI   Food/Nutrition Related Hx: Pt reports no interest in food, poor intake, weight loss over the past several months  Labs:  CMP     Component Value Date/Time   NA 131* 03/30/2012 0435   K 4.3 03/30/2012 0435   CL 98 03/30/2012 0435   CO2 23 03/30/2012 0435   GLUCOSE 186* 03/30/2012 0435   BUN 89* 03/30/2012 0435   CREATININE 5.05* 03/30/2012 0435   CREATININE 5.26* 03/29/2012 1537   CALCIUM 8.3* 03/30/2012 0435   PROT 5.4* 03/30/2012 0435   ALBUMIN 2.4* 03/30/2012 0435   AST 7 03/30/2012 0435   ALT 8 03/30/2012 0435   ALKPHOS 85 03/30/2012 0435   BILITOT 0.3 03/30/2012 0435   GFRNONAA 10* 03/30/2012 0435   GFRAA 11* 03/30/2012 0435     Lab Results  Component Value Date   HGBA1C 8.6 03/29/2012    Intake/Output Summary (Last 24 hours) at 03/30/12 1005 Last data filed at 03/30/12 0510  Gross per 24 hour  Intake      0 ml  Output    250 ml  Net   -250 ml     Diet Order: Carb Control  Supplements/Tube Feeding: None   IVF:    sodium chloride Last Rate: 75 mL/hr at 03/30/12 0748    Estimated Nutritional Needs:   Kcal: 1500-1700 Protein: 62-75 gm  Fluid:  1.5-1.7 L   Pt admitted with increased WBC, fatigue, increased BS, and weight loss. Pt has been traveling for several weeks and since then has lost his appetite and become more tired. Pt states that he has only been eating a bite or two of a meal or snack, not hungry and has no desire to eat. Pt has lost 12 lbs (8% body  weight) in 3 months or less, severe weight loss.  Pt meets criteria for severe malnutrition in the context of acute vs chronic illness 2/2 weight loss of 8% in less then 3 months and </=75% estimated needs met for >/= 1 month.  Did eat well this morning, 100% of breakfast. States he was hungry for the first time in a long time. Pt is agreeable to nutrition supplements. Will use Glucerna as pt's A1c is elevated indicating poor glucose control.   NUTRITION DIAGNOSIS: -Malnutrition (NI-5.2).  Status: Ongoing  RELATED TO: poor appetite, fatigue  AS EVIDENCE BY: 8% weight loss in less then 3 months, meeting </=75% needs for >/=1 month.   MONITORING/EVALUATION(Goals): Goal: PO intake to meet >90% estimated nutrition needs  Monitor: PO intake, weight, labs  EDUCATION NEEDS: -No education needs identified at this time    Clarene Duke RD, LDN Pager [phone] After Hours pager [phone]  03/30/2012, 10:02 AM

## 2012-03-30 NOTE — Progress Notes (Signed)
CRITICAL VALUE ALERT  Critical value received:  cbg 442  Date of notification:  03/30/12 Time of notification: 1510  Critical value read back:no  Nurse who received alert:    MD notified (1st page):  1517  Time of first page:   MD notified (2nd page):  Time of second page:  Responding MD:  Street  Time MD responded:  [phone]

## 2012-03-30 NOTE — Progress Notes (Signed)
I have seen and examined this patient. I have discussed with Dr Tye Savoy and MS IV Peggye Pitt.  I agree with their findings and plans as documented in their progress note.

## 2012-03-31 ENCOUNTER — Telehealth: Payer: Self-pay

## 2012-03-31 DIAGNOSIS — N10 Acute tubulo-interstitial nephritis: Secondary | ICD-10-CM | POA: Diagnosis present

## 2012-03-31 LAB — BASIC METABOLIC PANEL
CO2: 20 mEq/L (ref 19–32)
Calcium: 7.9 mg/dL — ABNORMAL LOW (ref 8.4–10.5)
Creatinine, Ser: 4.98 mg/dL — ABNORMAL HIGH (ref 0.50–1.35)
GFR calc non Af Amer: 10 mL/min — ABNORMAL LOW (ref >90)
Glucose, Bld: 504 mg/dL — ABNORMAL HIGH (ref 70–99)

## 2012-03-31 LAB — CBC
Hemoglobin: 10.1 g/dL — ABNORMAL LOW (ref 13.0–17.0)
MCH: 30.2 pg (ref 26.0–34.0)
MCHC: 34 g/dL (ref 30.0–36.0)
MCV: 88.9 fL (ref 78.0–100.0)
Platelets: 403 10*3/uL — ABNORMAL HIGH (ref 150–400)
RBC: 3.34 MIL/uL — ABNORMAL LOW (ref 4.22–5.81)

## 2012-03-31 LAB — FOLATE: Folate: 5.5 ng/mL

## 2012-03-31 LAB — GLUCOSE, CAPILLARY

## 2012-03-31 LAB — URINE CULTURE

## 2012-03-31 LAB — FERRITIN: Ferritin: 198 ng/mL (ref 22–322)

## 2012-03-31 MED ORDER — INSULIN ASPART 100 UNIT/ML ~~LOC~~ SOLN
10.0000 [IU] | Freq: Once | SUBCUTANEOUS | Status: AC
  Administered 2012-03-31: 10 [IU] via SUBCUTANEOUS

## 2012-03-31 MED ORDER — INSULIN GLARGINE 100 UNIT/ML ~~LOC~~ SOLN
20.0000 [IU] | Freq: Every day | SUBCUTANEOUS | Status: DC
  Administered 2012-03-31 – 2012-04-01 (×2): 20 [IU] via SUBCUTANEOUS

## 2012-03-31 MED ORDER — CEFTRIAXONE SODIUM 2 G IJ SOLR
2.0000 g | INTRAMUSCULAR | Status: DC
  Administered 2012-03-31 – 2012-04-02 (×3): 2 g via INTRAVENOUS
  Filled 2012-03-31 (×3): qty 2

## 2012-03-31 MED ORDER — INSULIN ASPART 100 UNIT/ML ~~LOC~~ SOLN
0.0000 [IU] | Freq: Three times a day (TID) | SUBCUTANEOUS | Status: DC
  Administered 2012-03-31: 15 [IU] via SUBCUTANEOUS
  Administered 2012-04-01: 5 [IU] via SUBCUTANEOUS
  Administered 2012-04-01: 8 [IU] via SUBCUTANEOUS
  Administered 2012-04-01: 15 [IU] via SUBCUTANEOUS
  Administered 2012-04-02: 8 [IU] via SUBCUTANEOUS
  Administered 2012-04-02: 5 [IU] via SUBCUTANEOUS

## 2012-03-31 MED ORDER — MIRTAZAPINE 7.5 MG PO TABS
7.5000 mg | ORAL_TABLET | Freq: Every day | ORAL | Status: DC
  Administered 2012-03-31 – 2012-04-01 (×2): 7.5 mg via ORAL
  Filled 2012-03-31 (×3): qty 1

## 2012-03-31 MED ORDER — DEXTROSE 5 % IV SOLN: 1.0000 g | INTRAVENOUS | Status: DC

## 2012-03-31 NOTE — Discharge Summary (Signed)
Physician Discharge Summary  Patient ID: Mario Turner MRN: [id number] DOB: 12-25-1927 Age: 78 y.o.  Admit date: 03/29/2012 Discharge date: 04/02/2012  PCP: No primary provider on file.  Consultants: None   Discharge Diagnosis: Problem List Acute Pyelonephritis, E. Coli Acute on Chronic Renal Failure Weight loss and anorexia, moderate protein-caloric malnutrition. Anemia of Renal Disease Diabetes mellitus, Type II, uncontrolled.  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  Patient does not desire hemodialysis.  Depression, mild.  HTN GERD  BPH   Hospital Course 78yo M w/ HTN, HLD, CKD (stage V) and DM2 presented with fatigue, sleepiness and found to have WBC count >25k and hyperglycemia.  1. Acute Pyelonephritis and  leukocytosis:  On admission, it was thought that the patient's elevated WBC could indicate an underlying malignancy. However, a peripheral smear was normal. On hospital day 2, the patient's urine cultures grew >100,000 E. Coli that was pansensitive except for TMP/SMX. The patient had an elevated WBC of 15.3 which was neutrophil predominant. This has trended down from admission WBC of 20.0. The patient has remained afebrile throughout this hospitalization. There was also concern that the patient's newly identified UTI may have been the source of his elevated CBGs. The patient was started on Ceftriaxone 2g QD in the hospital and discharged home on oral Levaquin every other day to complete a 14 day course of antibiotics.  2. Weight loss, anorexia:  On admission, an AM cortisol and TSH where checked which were both wnl and blood and urine cultures were obtained. It was thought that these symptoms could be due to an underlying malignancy since the patient also presented with a leukocytosis. However, a peripheral smear was normal and an abdominal ultrasound was unremarkable. The patient's home bupropion was held in the hospital since this is known to cause weight loss in the elderly. During his  hospitalization, the patient was eating well and finished all of his meals.  Mirtazapine was added to patient's medication regimen to aid with weight gain.  3. Anemia of Renal Disease:  The patient was found to have a normocytic anemia with a low retic count, Ferritin 198 ng/ml (WNL), Iron 49 mcg/dl (Low), TIBC 782 mcg/dL (Low) and percent saturation 17 (Low) consistent with Anemia of Renal Disease.  It was felt that this patient would most likely benefit from outpatient follow up with Nephrology or Hematology  for possible EPO injections.    4. Diabetes Mellitus, Type II, uncontrolled.  On admission, the patient's hgba1c was 8.6.  Patient has continued to have significantly elevated blood sugars during this hospital stay and is currently on SSI. The patient was started on Lantus to 20 units daily and placed on moderate SSI.  Lantus was later increased to 25 U daily.  Patient was discharged on Lantus 25 U daily and Novolog 8 U TID.  5. CKD - stage 5.   On admission, patient's Creatinine was 5.26.  Patient had been told prior that he needed dialysis and he refused. Prior medical records from [facility] indicate baseline creatinine of 2.3 in February 2013 .  Patient was treated with IVF's and his Lisinopril was stopped with improvement in creatinine (Cr 4.46, Potassium 4.7, Bicarb 21, BUN 69  on 04/02/12).    6. Depression: Patient's bupropion was stopped in the hospital due to its side effect of weight loss in the elderly. Patient was started on Remeron. Patient's Mini-Mental Status Exam was 27 out 30 during this admission.   7. HTN: Patient's home lisinopril was stopped due to  elevated creatinine and his home metoprolol was stopped due to hypotension.   8. GERD: The patient's PPI was continued during this hospitalization and he tolerated it well.   9. BPH: Doxazosin was held in the setting of hypotension during admission.  Will be restarted at discharge. Patient's post-void residual off of  Doxazosin was 28 mL.   Procedures/Imaging:  Dg Chest 2 View  03/29/2012  *RADIOLOGY REPORT*  Clinical Data: Chest pain and shortness of breath.  CHEST - 2 VIEW  Comparison: None.  Findings: Lungs are hyperexpanded.  No edema or focal airspace consolidation.  Small nodular densities are seen in the right mid lung and in the left apex.  Given the conspicuity relative to their tiny size, they are most likely mineralized and represent calcified granulomata. The cardiopericardial silhouette is within normal limits for size. Imaged bony structures of the thorax are intact.  IMPRESSION: No acute cardiopulmonary process.   Original Report Authenticated By: ERIC A. MANSELL, M.D.    US Abdomen Complete  03/30/2012  *RADIOLOGY REPORT*  Clinical Data:  History of bladder cancer, evaluate for abdominal mass  COMPLETE ABDOMINAL ULTRASOUND  Comparison:  None.  Findings:  Gallbladder:  There is a minimal amount of layering echogenic debris/sludge within an otherwise normal-appearing gallbladder.  No gallbladder wall thickening or pericholecystic fluid.  Negative sonographic Murphy's sign.  Common bile duct:  Normal in size measuring 3.9 mm in diameter.  Liver:  Homogeneous hepatic echotexture.  No discrete hepatic lesions.  No definite evidence of intrahepatic biliary ductal dilatation.  No ascites.  IVC:  Appears normal.  Pancreas:  Limited visualization of the pancreatic head and neck is normal.  Visualization of the pancreatic body and tail is obscured by bowel gas.  Spleen:  Normal in size measuring 13 cm in length.  Right Kidney:  Normal cortical thickness, echogenicity and size, measuring 10.5 cm in length.  No focal renal lesions.  No echogenic renal stones.  No urinary obstruction.  Left Kidney:  Normal cortical thickness, echogenicity and size, measuring 10.9 cm in length.  Note is made of an approximately 0.8 cm anechoic lesion within the superior pole of the left kidney (image 48) which is too small to accurately  characterize but favored to represent a renal cyst.   No echogenic renal stones.  No urinary obstruction.  Abdominal aorta:  No aneurysm identified.  IMPRESSION: 1.  No evidence of abdominal mass. 2.  Minimal amount of sludge within an otherwise normal-appearing gallbladder. 3.  Sub centimeter anechoic lesion within the left kidney is too small to accurately characterize but favored to represent a renal cyst.   Original Report Authenticated By: Waynard Reeds, M.D.    Dg Abd Acute W/chest  03/29/2012  *RADIOLOGY REPORT*  Clinical Data: Abdominal discomfort.  Weight loss.  ACUTE ABDOMEN SERIES (ABDOMEN 2 VIEW & CHEST 1 VIEW)  Comparison: None.  Findings: Mildly prominent loops of small bowel on the supine view, but no air-fluid levels on the erect view.  This is nonspecific. It may reflect a minor adynamic ileus, or even be a normal finding in this patient.  There is no evidence of obstruction and no free air.  The abdominal soft tissues are unremarkable.  The included chest radiograph demonstrates he healed granuloma in the upper lobes but otherwise clear lungs and in unremarkable heart, mediastinum and hila.  The bones are demineralized and there are degenerative changes most evident of the lumbar spine.  No osteoblastic or osteolytic lesions.  IMPRESSION: No  obstruction or free air.  No active disease of the chest radiograph.   Original Report Authenticated By: Domenic Moras, M.D.     Labs  CBC  Lab 04/02/12 0540 04/01/12 0610 03/31/12 0850  WBC 13.2* 14.5* 15.9*  HGB 9.0* 9.5* 10.1*  HCT 26.5* 27.2* 29.7*  PLT 356 366 403*   BMET  Lab 04/02/12 0540 04/01/12 0610 03/31/12 0850 03/30/12 0435 03/29/12 2105 03/29/12 1537  NA 135 130* 128* -- -- --  K 4.7 4.7 4.9 -- -- --  CL 103 100 96 -- -- --  CO2 21 18* 20 -- -- --  BUN 69* 79* 82* -- -- --  CREATININE 4.46* 4.71* 4.98* -- -- --  CALCIUM 8.0* 8.4 7.9* -- -- --  PROT -- -- -- 5.4* 6.1 6.3  BILITOT -- -- -- 0.3 0.3 0.4  ALKPHOS -- -- --  85 110 103  ALT -- -- -- 8 7 10   AST -- -- -- 7 7 8   GLUCOSE 174* 233* 504* -- -- --   Results for orders placed during the hospital encounter of 03/29/12 (from the past 72 hour(s))  GLUCOSE, CAPILLARY     Status: Abnormal   Collection Time   03/30/12  4:54 PM      Component Value Range Comment   Glucose-Capillary 442 (*) 70 - 99 mg/dL    Comment 1 Documented in Chart      Comment 2 Notify RN     BASIC METABOLIC PANEL     Status: Abnormal   Collection Time   03/30/12  5:49 PM      Component Value Range Comment   Sodium 126 (*) 135 - 145 mEq/L    Potassium 4.5  3.5 - 5.1 mEq/L    Chloride 94 (*) 96 - 112 mEq/L    CO2 22  19 - 32 mEq/L    Glucose, Bld 428 (*) 70 - 99 mg/dL    BUN 88 (*) 6 - 23 mg/dL    Creatinine, Ser 4.54 (*) 0.50 - 1.35 mg/dL    Calcium 7.9 (*) 8.4 - 10.5 mg/dL    GFR calc non Af Amer 10 (*) >90 mL/min    GFR calc Af Amer 11 (*) >90 mL/min   GLUCOSE, CAPILLARY     Status: Abnormal   Collection Time   03/30/12  9:15 PM      Component Value Range Comment   Glucose-Capillary 313 (*) 70 - 99 mg/dL   GLUCOSE, CAPILLARY     Status: Abnormal   Collection Time   03/31/12  7:30 AM      Component Value Range Comment   Glucose-Capillary 447 (*) 70 - 99 mg/dL    Comment 1 Documented in Chart      Comment 2 Notify RN     CBC     Status: Abnormal   Collection Time   03/31/12  8:50 AM      Component Value Range Comment   WBC 15.9 (*) 4.0 - 10.5 K/uL    RBC 3.34 (*) 4.22 - 5.81 MIL/uL    Hemoglobin 10.1 (*) 13.0 - 17.0 g/dL    HCT 09.8 (*) 11.9 - 52.0 %    MCV 88.9  78.0 - 100.0 fL    MCH 30.2  26.0 - 34.0 pg    MCHC 34.0  30.0 - 36.0 g/dL    RDW 14.7  82.9 - 56.2 %    Platelets 403 (*) 150 - 400 K/uL  BASIC METABOLIC PANEL     Status: Abnormal   Collection Time   03/31/12  8:50 AM      Component Value Range Comment   Sodium 128 (*) 135 - 145 mEq/L    Potassium 4.9  3.5 - 5.1 mEq/L    Chloride 96  96 - 112 mEq/L    CO2 20  19 - 32 mEq/L    Glucose, Bld 504 (*)  70 - 99 mg/dL    BUN 82 (*) 6 - 23 mg/dL    Creatinine, Ser 1.61 (*) 0.50 - 1.35 mg/dL    Calcium 7.9 (*) 8.4 - 10.5 mg/dL    GFR calc non Af Amer 10 (*) >90 mL/min    GFR calc Af Amer 11 (*) >90 mL/min   GLUCOSE, CAPILLARY     Status: Abnormal   Collection Time   03/31/12 11:22 AM      Component Value Range Comment   Glucose-Capillary 492 (*) 70 - 99 mg/dL   GLUCOSE, CAPILLARY     Status: Abnormal   Collection Time   03/31/12  5:01 PM      Component Value Range Comment   Glucose-Capillary 400 (*) 70 - 99 mg/dL   VITAMIN W96     Status: Normal   Collection Time   03/31/12  6:34 PM      Component Value Range Comment   Vitamin B-12 252  211 - 911 pg/mL   FOLATE     Status: Normal   Collection Time   03/31/12  6:34 PM      Component Value Range Comment   Folate 5.5     IRON AND TIBC     Status: Abnormal   Collection Time   03/31/12  6:34 PM      Component Value Range Comment   Iron 29 (*) 42 - 135 ug/dL    TIBC 045 (*) 409 - 811 ug/dL    Saturation Ratios 17 (*) 20 - 55 %    UIBC 137  125 - 400 ug/dL   FERRITIN     Status: Normal   Collection Time   03/31/12  6:34 PM      Component Value Range Comment   Ferritin 198  22 - 322 ng/mL   RETICULOCYTES     Status: Abnormal   Collection Time   03/31/12  6:34 PM      Component Value Range Comment   Retic Ct Pct 1.0  0.4 - 3.1 %    RBC. 3.09 (*) 4.22 - 5.81 MIL/uL    Retic Count, Manual 30.9  19.0 - 186.0 K/uL   GLUCOSE, CAPILLARY     Status: Abnormal   Collection Time   03/31/12  9:34 PM      Component Value Range Comment   Glucose-Capillary 228 (*) 70 - 99 mg/dL    Comment 1 Documented in Chart      Comment 2 Notify RN     BASIC METABOLIC PANEL     Status: Abnormal   Collection Time   04/01/12  6:10 AM      Component Value Range Comment   Sodium 130 (*) 135 - 145 mEq/L    Potassium 4.7  3.5 - 5.1 mEq/L    Chloride 100  96 - 112 mEq/L    CO2 18 (*) 19 - 32 mEq/L    Glucose, Bld 233 (*) 70 - 99 mg/dL    BUN 79 (*) 6 - 23  mg/dL  Creatinine, Ser 4.71 (*) 0.50 - 1.35 mg/dL    Calcium 8.4  8.4 - 16.1 mg/dL    GFR calc non Af Amer 11 (*) >90 mL/min    GFR calc Af Amer 12 (*) >90 mL/min   CBC     Status: Abnormal   Collection Time   04/01/12  6:10 AM      Component Value Range Comment   WBC 14.5 (*) 4.0 - 10.5 K/uL    RBC 3.07 (*) 4.22 - 5.81 MIL/uL    Hemoglobin 9.5 (*) 13.0 - 17.0 g/dL    HCT 09.6 (*) 04.5 - 52.0 %    MCV 88.6  78.0 - 100.0 fL    MCH 30.9  26.0 - 34.0 pg    MCHC 34.9  30.0 - 36.0 g/dL    RDW 40.9  81.1 - 91.4 %    Platelets 366  150 - 400 K/uL   GLUCOSE, CAPILLARY     Status: Abnormal   Collection Time   04/01/12  7:59 AM      Component Value Range Comment   Glucose-Capillary 211 (*) 70 - 99 mg/dL   GLUCOSE, CAPILLARY     Status: Abnormal   Collection Time   04/01/12 11:54 AM      Component Value Range Comment   Glucose-Capillary 387 (*) 70 - 99 mg/dL   GLUCOSE, CAPILLARY     Status: Abnormal   Collection Time   04/01/12  5:06 PM      Component Value Range Comment   Glucose-Capillary 300 (*) 70 - 99 mg/dL   GLUCOSE, CAPILLARY     Status: Abnormal   Collection Time   04/01/12  9:27 PM      Component Value Range Comment   Glucose-Capillary 223 (*) 70 - 99 mg/dL   CBC     Status: Abnormal   Collection Time   04/02/12  5:40 AM      Component Value Range Comment   WBC 13.2 (*) 4.0 - 10.5 K/uL    RBC 3.01 (*) 4.22 - 5.81 MIL/uL    Hemoglobin 9.0 (*) 13.0 - 17.0 g/dL    HCT 78.2 (*) 95.6 - 52.0 %    MCV 88.0  78.0 - 100.0 fL    MCH 29.9  26.0 - 34.0 pg    MCHC 34.0  30.0 - 36.0 g/dL    RDW 21.3  08.6 - 57.8 %    Platelets 356  150 - 400 K/uL   RENAL FUNCTION PANEL     Status: Abnormal   Collection Time   04/02/12  5:40 AM      Component Value Range Comment   Sodium 135  135 - 145 mEq/L    Potassium 4.7  3.5 - 5.1 mEq/L    Chloride 103  96 - 112 mEq/L    CO2 21  19 - 32 mEq/L    Glucose, Bld 174 (*) 70 - 99 mg/dL    BUN 69 (*) 6 - 23 mg/dL    Creatinine, Ser 4.69  (*) 0.50 - 1.35 mg/dL    Calcium 8.0 (*) 8.4 - 10.5 mg/dL    Phosphorus 2.9  2.3 - 4.6 mg/dL    Albumin 2.1 (*) 3.5 - 5.2 g/dL    GFR calc non Af Amer 11 (*) >90 mL/min    GFR calc Af Amer 13 (*) >90 mL/min   GLUCOSE, CAPILLARY     Status: Abnormal   Collection Time   04/02/12  7:53  AM      Component Value Range Comment   Glucose-Capillary 201 (*) 70 - 99 mg/dL   GLUCOSE, CAPILLARY     Status: Abnormal   Collection Time   04/02/12 12:25 PM      Component Value Range Comment   Glucose-Capillary 286 (*) 70 - 99 mg/dL      Patient condition at time of discharge/disposition: stable  Disposition-home   Follow up issues: 1. Completion of antibiotic course for total of 14 days antibiotics 2. Anemia of Renal Disease 3. Hyperglycemia/DM2.  May need to titrate insulin regimen to achieve optimal control. 4. Depression.  Patient started on Mirtazapine during admission, may benefit from further titration. 5. Weight gain and nutrition need to be discussed. 6. HTN - considering restart BP meds if patient continues to be hypertensive.  Discharge follow up:  Follow-up Information    Follow up with [facility] In 4 days. (Walk-in for appointment)    Contact information:   [address] [place] [address] [phone]          [place], [place]  Home Medication Instructions [id number]   Printed on:04/02/12 1330  Medication Information                    simvastatin (ZOCOR) 10 MG tablet Take 10 mg by mouth at bedtime.           doxazosin (CARDURA) 2 MG tablet Take 2 mg by mouth at bedtime.           aspirin 81 MG tablet Take 81 mg by mouth daily.           omeprazole (PRILOSEC) 20 MG capsule Take 20 mg by mouth daily.           senna-docusate (SENOKOT-S) 8.6-50 MG per tablet Take 1 tablet by mouth daily.           ondansetron (ZOFRAN) 4 MG tablet Take 1 tablet (4 mg total) by mouth every 8 (eight) hours as needed.           feeding supplement  (GLUCERNA SHAKE) LIQD Take 237 mLs by mouth 3 (three) times daily between meals.           insulin glargine (LANTUS) 100 UNIT/ML injection Inject 25 Units into the skin daily.           mirtazapine (REMERON) 7.5 MG tablet Take 1 tablet (7.5 mg total) by mouth at bedtime.           insulin aspart (NOVOLOG) 100 UNIT/ML injection Inject 8 Units into the skin 3 (three) times daily before meals.           levofloxacin (LEVAQUIN) 250 MG tablet Take 1 tablet (250 mg total) by mouth every other day.                 Attending FMTS Discharge Note I discussed with Dr Adriana Simas.  I agree with their plans documented in their discharge note above.  Acquanetta Belling, MD

## 2012-03-31 NOTE — Progress Notes (Signed)
CRITICAL VALUE ALERT  Critical value received:  CBG 492  Date of notification: 03/31/12  Time of notification: 1125  Critical value read back:yes  Nurse who received alert:  Peter Congo  MD notified (1st page): Adriana Simas  Time of first page:1130  MD notified (2nd page):  Time of second page:  Responding MD:  Adriana Simas  Time MD responded:  1135

## 2012-03-31 NOTE — Progress Notes (Signed)
Palliative medicine Team consult received; spoke with Dr Durene Cal and Dr Eugenie Birks to confirm goals and assess hospice options; spoke with patient and son Mario Turner at bedside meeting scheduled for tomorrow Thursday 04/01/12 @ 8:30 am.  Valente David, RN 03/31/2012, 6:25 PM Palliative Medicine Team RN Liaison [phone]

## 2012-03-31 NOTE — Telephone Encounter (Signed)
Patient's son is calling to verify that Dr. Patsy Lager is taking new patients because he would like the Dr to be his father's PCP. I informed him that his father can be seen whenever she is in the walk in clinic. He would like to speak to someone else about how our clinic works. His name is Renae Fickle. He can be reached at [phone].

## 2012-03-31 NOTE — Progress Notes (Signed)
CRITICAL VALUE ALERT  Critical value received:  CBG 447  Date of notification:  03/31/12  Time of notification: 0745  Critical value read back:yes  Nurse who received alert: Peter Congo  MD notified (1st page):  Family Practice  Time of first page: [phone]  MD notified (2nd page):  Time of second page:  Responding MD:  Adriana Simas  Time MD responded:  [phone]

## 2012-03-31 NOTE — Progress Notes (Addendum)
Family Medicine Teaching Service                                                                 [facility] Progress Note     Patient name: Mario Turner Medical record number: [id number] Date of birth: 12-04-1927 Age: 78 y.o. Gender: male    LOS: 2 days   Primary Care Provider: No primary provider on file.  Overnight Events: No acute events overnight. Patient continued to have elevated blood glucose levels.   Objective: Vital signs in last 24 hours: BP 111/43  Pulse 87  Temp 97.5 F (36.4 C) (Oral)  Resp 18  Ht 5' 8.9" (1.75 m)  Wt 136 lb 14.5 oz (62.1 kg)  BMI 20.28 kg/m2  SpO2 93%    Physical Exam: Gen: NAD HEENT:  Mucosa moist. PEERL, EOMI. No cervical adenopathy.  CV: RRR. No murmurs. Res: Clear breath sounds. No rales. No wheezes. Abd: Soft. Non tender. Non distended. Ext/Musc: No edema. atrophy of the R thenar prominence. Neuro:Oriented in. No focal motor or sensory deficits.  Labs/Studies: Chem: NA 126  K 4.5  Cl 94  CO2 22  BUN 88  Creatinine 5.02  Calcium 7.9  Glucose 428  Retic Ct pct 0.9 Retic count, manual 27.8  US Abdomen IMPRESSION:  1. No evidence of abdominal mass.  2. Minimal amount of sludge within an otherwise normal-appearing  gallbladder.  3. Sub centimeter anechoic lesion within the left kidney is too  small to accurately characterize but favored to represent a renal  cyst.  Post void residual - 28mL  Medications: Scheduled Meds:   . aspirin  81 mg Oral Daily  . feeding supplement  237 mL Oral TID BM  . heparin  5,000 Units Subcutaneous Q8H  . influenza  inactive virus vaccine  0.5 mL Intramuscular Tomorrow-1000  . insulin aspart  0-9 Units Subcutaneous TID WC  . pantoprazole  40 mg Oral Q1200  . simvastatin  10 mg Oral QHS  . DISCONTD: buPROPion  150 mg Oral BID  . DISCONTD: doxazosin  2 mg Oral QHS  . DISCONTD: metoprolol tartrate  25 mg Oral BID   Continuous Infusions:   .  sodium chloride 75 mL/hr at 03/31/12 0605   PRN Meds:.     Assessment/Plan:  78yo M w/ HTN, HLD, CKD (stage unknown) and DM2 presents with constipation, anorexia, weight loss, and anemia.   1. Weight loss/constipation/fatigue/anorexia: abdominal ultrasound was unremarkable. Since his admission, the patient has been eating well and finishing all of his meals. Patient was on bupropion at home which could have contributed to his anorexia. This medication has been permanently discontinued.  --dehydration from elevated CBGs could have also been contributing, have rehydrated and attempting to control CBGs  2. Anemia: The patient has a normocytic anemia with a low retic count. This is could be due to decreased erythropoietin production due to his CKD or it could be due to some kind of malignancy. -- f/u with renal outpaitent for possible EPO injections  3. Neutrophilia: Urine culture grew >100,000 gram negative rods which indicates a likely urinary source. The patient had an elevated WBC of 15.3 which was neutrophil predominant. This has trended down from admission when his WBC was 20.0. The patient has remained afebrile throughout  this hospitalization.  Also concerned that UTI may be source of elevated CBGs -- Ceftriaxone 2g QD -- Will start Cefuroxime 250mg  q 12 hours X 7 days total at discharge  -- f/u CBC  4. Hyperglycemia: Patient has continued to have significantly elevated blood sugars during this hospital stay and is currently on SSI. This morning the patient's blood glucose was 442 and he received 9 units of novolog.  -- start Lantus to 20 units daily, likely will increase to 25 units in AM -- Moderate SSI  5. CKD: Stage is unclear. Patient states he was told he needed dialysis in the past but has refused. The patient's creatinine continues to remain elevated at 5.02 this morning.  --will attempt to obtain records, will need follow up outpatient. Adequate urine output even off of  doxazosin.   6: HTN/HLD: Patient's home lisinopril was held due to elevated creatinine and his home metoprolol was held due to hypotension.  7. GERD  -- Continue PPI   8. BPH: doxazosin was held in the setting of hypotension  9. DVT PPx: heparin SQ   10. Dispo: pending further workup   11. Code Status: dnr/dni/no dialysis  Duane Lope  Family Medicine Upper Level Addendum:   I have seen and examined the patient independently, discussed with [name], MSIV fully reviewed the progress note and agree with it's contents with the additions as noted in blue text. My independent exam is below.   BP 111/43  Pulse 87  Temp 97.5 F (36.4 C) (Oral)  Resp 18  Ht 5' 8.9" (1.75 m)  Wt 136 lb 14.5 oz (62.1 kg)  BMI 20.28 kg/m2  SpO2 93% Gen: NAD, resting comfortably in bed, thin elderly gentleman HEENT: NCAT, MMM, PERRLA  CV: RRR no mrg  Lungs: CTAB  Abd: soft/nontender/nondistended/normal bowel sounds  MSK: moves all extremities, no edema  Skin: warm and dry, no rash  Neuro: grossly normal  Will treat UTI given WBC but also concerned for possible malignancy given slow decline. Will have patient followed up outpatient and consider referral to hem/onc. Will keep for 1 more day to attempt to improve CBG control.    Tana Conch, MD, PGY2 03/31/2012 12:39 PM

## 2012-03-31 NOTE — Progress Notes (Signed)
Occupational Therapy Evaluation Patient Details Name: Mario Turner MRN: [id number] DOB: 1927/11/19 Today's Date: 03/31/2012 Time: 4098-1191 OT Time Calculation (min): 40 min  OT Assessment / Plan / Recommendation Clinical Impression  78 yo with recent admission d/t UTI. PTA, pt lived independently in [place] and was here on vacation. Family has expressed concerns over pt living independently due to concerns over cognition.. Pt with apparent cognitive deficits and would benefit from f.u with primary caregiver to address further. Pt compensates well for deficits. Do not feel pt is able to live safely independently. Feel pt would be excellent candidate for ALF. Son is interested in talking with a SW regarding ALF. Discussed nec equipment needs and son staed he could borrow these itmes. However, pt would benefit from using a RW due to balance deficits.     OT Assessment  Patient does not need any further OT services    Follow Up Recommendations  Other (comment);Home health OT (vs ALF)    Barriers to Discharge      Equipment Recommendations  Rolling walker with 5" wheels;Tub/shower seat    Recommendations for Other Services  SW to discuss ALF with son  Frequency       Precautions / Restrictions Precautions Precautions: Fall Precaution Comments: cognitive deficits   Pertinent Vitals/Pain No pain    ADL  Eating/Feeding: Simulated;Independent Where Assessed - Eating/Feeding: Chair Grooming: Simulated;Modified independent Where Assessed - Grooming: Unsupported sitting Upper Body Bathing: Simulated;Supervision/safety;Set up Where Assessed - Upper Body Bathing: Unsupported sitting Lower Body Bathing: Simulated;Set up;Supervision/safety Where Assessed - Lower Body Bathing: Supported sit to stand Upper Body Dressing: Simulated;Set up;Supervision/safety Where Assessed - Upper Body Dressing: Unsupported sitting Lower Body Dressing: Performed;Set up;Supervision/safety Where Assessed -  Lower Body Dressing: Supported sit to stand Toilet Transfer: Performed;Min Pension scheme manager Method: Sit to stand;Stand pivot Equipment Used: Gait belt Transfers/Ambulation Related to ADLs: Min guard. Balance deficits ADL Comments: impaired by balance deficits    OT Diagnosis:    OT Problem List:   OT Treatment Interventions:     OT Goals Acute Rehab OT Goals OT Goal Formulation:  (eval only)  Visit Information  Last OT Received On: 03/31/12 Assistance Needed: +1    Subjective Data      Prior Functioning     Home Living Lives With: Alone;Son Available Help at Discharge: Family;Available 24 hours/day Type of Home: House Home Access: Stairs to enter [employer] of Steps: 4 Entrance Stairs-Rails: Right Home Layout: One level Bathroom Shower/Tub: Health visitor: Standard Bathroom Accessibility: Yes How Accessible: Accessible via walker Home Adaptive Equipment: None Additional Comments: Pt plans to live with son. Is interested in ALF. Prior Function Level of Independence: Independent Able to Take Stairs?: Yes Driving: Yes Vocation: Retired Musician: [employer] Dominant Hand: Right         Vision/Perception     Cognition  Overall Cognitive Status: Impaired Area of Impairment: Memory;Safety/judgement;Awareness of deficits;Awareness of errors;Problem solving;Executive functioning Arousal/Alertness: Awake/alert Orientation Level: Appears intact for tasks assessed Behavior During Session: [facility] for tasks performed Memory: Decreased recall of precautions Memory Deficits: unable to remember room # after telling pt x 2 Safety/Judgement - Other Comments: decreased anaticipatory awareness Awareness of Deficits: appears to compensate for cognitive deficits Problem Solving: assist for higher level tasks Executive Functioning: decrased reasoning. confabulation Cognition - Other Comments: Family reports pt leaving on burners,  losing items freqently, repeating self, making up stories, etc.    Extremity/Trunk Assessment Right Upper Extremity Assessment RUE ROM/Strength/Tone: Within functional levels RUE  Sensation: WFL - Light Touch;WFL - Proprioception RUE Coordination: WFL - gross/fine motor Left Upper Extremity Assessment LUE ROM/Strength/Tone: Within functional levels LUE Sensation: WFL - Light Touch;WFL - Proprioception LUE Coordination: WFL - gross/fine motor Right Lower Extremity Assessment RLE ROM/Strength/Tone: WFL for tasks assessed Left Lower Extremity Assessment LLE ROM/Strength/Tone: WFL for tasks assessed Trunk Assessment Trunk Assessment: Normal;Other exceptions (forward head)     Mobility Bed Mobility Bed Mobility: Supine to Sit Supine to Sit: 6: Modified independent (Device/Increase time) Sitting - Scoot to Edge of Bed: 6: Modified independent (Device/Increase time) Transfers Transfers: Sit to Stand;Stand to Sit Sit to Stand: 4: Min guard;From bed Stand to Sit: 4: Min guard;To chair/3-in-1;With upper extremity assist Details for Transfer Assistance: loss of balance during sit - stand. REquired A from therapist to correct.  PT reports 2 recent falls.     Shoulder Instructions     Exercise     Balance  Min A at times   End of Session OT - End of Session Equipment Utilized During Treatment: Gait belt Activity Tolerance: Patient tolerated treatment well Patient left: in chair;with call bell/phone within reach;with family/visitor present Nurse Communication: Mobility status  GO Functional Assessment Tool Used: clinical judgement Functional Limitation: Self care Self Care Current Status (J4782): At least 1 percent but less than 20 percent impaired, limited or restricted Self Care Goal Status (N5621): At least 1 percent but less than 20 percent impaired, limited or restricted Self Care Discharge Status [phone]): At least 1 percent but less than 20 percent impaired, limited or restricted     Marian Grandt,HILLARY 03/31/2012, 5:05 PM [facility], OTR/L  [phone] 03/31/2012

## 2012-04-01 ENCOUNTER — Encounter (HOSPITAL_COMMUNITY): Payer: Self-pay | Admitting: Family Medicine

## 2012-04-01 DIAGNOSIS — D72829 Elevated white blood cell count, unspecified: Secondary | ICD-10-CM

## 2012-04-01 DIAGNOSIS — A499 Bacterial infection, unspecified: Secondary | ICD-10-CM

## 2012-04-01 DIAGNOSIS — N185 Chronic kidney disease, stage 5: Secondary | ICD-10-CM

## 2012-04-01 DIAGNOSIS — E441 Mild protein-calorie malnutrition: Secondary | ICD-10-CM | POA: Diagnosis present

## 2012-04-01 DIAGNOSIS — E119 Type 2 diabetes mellitus without complications: Secondary | ICD-10-CM

## 2012-04-01 DIAGNOSIS — N390 Urinary tract infection, site not specified: Secondary | ICD-10-CM

## 2012-04-01 DIAGNOSIS — N189 Chronic kidney disease, unspecified: Secondary | ICD-10-CM | POA: Diagnosis present

## 2012-04-01 LAB — BASIC METABOLIC PANEL
CO2: 18 mEq/L — ABNORMAL LOW (ref 19–32)
Chloride: 100 mEq/L (ref 96–112)
Creatinine, Ser: 4.71 mg/dL — ABNORMAL HIGH (ref 0.50–1.35)
Glucose, Bld: 233 mg/dL — ABNORMAL HIGH (ref 70–99)

## 2012-04-01 LAB — GLUCOSE, CAPILLARY: Glucose-Capillary: 211 mg/dL — ABNORMAL HIGH (ref 70–99)

## 2012-04-01 LAB — CBC
HCT: 27.2 % — ABNORMAL LOW (ref 39.0–52.0)
MCH: 30.9 pg (ref 26.0–34.0)
MCV: 88.6 fL (ref 78.0–100.0)
RBC: 3.07 MIL/uL — ABNORMAL LOW (ref 4.22–5.81)
RDW: 13.2 % (ref 11.5–15.5)
WBC: 14.5 10*3/uL — ABNORMAL HIGH (ref 4.0–10.5)

## 2012-04-01 LAB — IRON AND TIBC
Iron: 29 ug/dL — ABNORMAL LOW (ref 42–135)
Saturation Ratios: 17 % — ABNORMAL LOW (ref 20–55)
UIBC: 137 ug/dL (ref 125–400)

## 2012-04-01 MED ORDER — LORAZEPAM 1 MG PO TABS: 1.0000 mg | ORAL_TABLET | ORAL | Status: DC | PRN

## 2012-04-01 MED ORDER — INSULIN GLARGINE 100 UNIT/ML ~~LOC~~ SOLN
25.0000 [IU] | Freq: Every day | SUBCUTANEOUS | Status: DC
Start: 2012-04-02 — End: 2012-04-02
  Administered 2012-04-02: 25 [IU] via SUBCUTANEOUS

## 2012-04-01 MED ORDER — ONDANSETRON HCL 4 MG PO TABS: 4.0000 mg | ORAL_TABLET | Freq: Three times a day (TID) | ORAL | Status: DC | PRN

## 2012-04-01 MED ORDER — SENNOSIDES-DOCUSATE SODIUM 8.6-50 MG PO TABS
1.0000 | ORAL_TABLET | Freq: Every day | ORAL | Status: DC
  Administered 2012-04-01: 1 via ORAL
  Filled 2012-04-01: qty 1

## 2012-04-01 MED ORDER — ATROPINE SULFATE 1 % OP SOLN
2.0000 [drp] | Freq: Four times a day (QID) | OPHTHALMIC | Status: DC | PRN
  Filled 2012-04-01: qty 2

## 2012-04-01 MED ORDER — ACETAMINOPHEN 325 MG PO TABS: 650.0000 mg | ORAL_TABLET | Freq: Four times a day (QID) | ORAL | Status: DC | PRN

## 2012-04-01 MED ORDER — OXYCODONE HCL 5 MG PO TABS: 5.0000 mg | ORAL_TABLET | ORAL | Status: DC | PRN

## 2012-04-01 NOTE — Progress Notes (Signed)
I have seen and examined this patient. I have discussed with Dr Durene Cal & MS IV Peggye Pitt.  I agree with their findings and plans as documented in their progress note. I spoke with his son and dgt-in-law during in patient's room during the interview.   Acute Issues 1. Acute on Chronic Kidney Failure - Likely causes of hypovolemia and E. Coli urinary tract infection. - improving slowly with IVF resuscitation and antibiotics - Will likely need 2 to 3 more days of slow IVF resuscitation (to avoid acute decompensated heart failure from overly rapid resuscitation)  2. Pyelonephritis, Acute, E. Coli.  - Given systemic symptoms/signs of Leukocytosis, anorexia, weight loss, malnutrition, I suspect an upper urinary tract infection.   - Continue IV Rocephin until improvement in signs/symptoms of systemic inflammatory illness.   3. Depressive symptoms. - Patient denies thoughts of self-harm actively or thru neglect.  - He denies ahedonia. - His sadness is not persistent.  - Agree that if patient were to be treated with an antidepressant, that Remeron with its appetite stimulation effect, rather than Bupropion that is associated with weight loss, would be a better choice.

## 2012-04-01 NOTE — Progress Notes (Signed)
Family Medicine Teaching Service                                                                 [facility] Progress Note     Patient name: Mario Turner Medical record number: [id number] Date of birth: 29-Dec-1927 Age: 78 y.o. Gender: male    LOS: 3 days   Primary Care Provider: No primary provider on file.  Overnight Events: No acute events overnight. This morning patient has a depressed mood. He states that he feels he is not going to get better and does not know what the point of living is.   Objective: Vital signs in last 24 hours: BP 128/64  Pulse 74  Temp 97.5 F (36.4 C) (Oral)  Resp 18  Ht 5' 8.9" (1.75 m)  Wt 139 lb 12.4 oz (63.4 kg)  BMI 20.70 kg/m2  SpO2 97%    Physical Exam:   Labs/Studies: Chem: Na 130  K 4.7  Cl 100  CO2 18  BUN 79  Creatinine 4.71  Calcium 8.4  Glucose 233 CBC: WBC 14.5  Hgb 9.5  Hct 27.2  Platelets 366    Medications: Scheduled Meds:   . cefTRIAXone (ROCEPHIN)  IV  2 g Intravenous Q24H  . feeding supplement  237 mL Oral TID BM  . heparin  5,000 Units Subcutaneous Q8H  . influenza  inactive virus vaccine  0.5 mL Intramuscular Tomorrow-1000  . insulin aspart  0-15 Units Subcutaneous TID WC  . insulin aspart  10 Units Subcutaneous Once  . insulin glargine  20 Units Subcutaneous Daily  . mirtazapine  7.5 mg Oral QHS  . pantoprazole  40 mg Oral Q1200  . DISCONTD: aspirin  81 mg Oral Daily  . DISCONTD: insulin aspart  0-9 Units Subcutaneous TID WC  . DISCONTD: simvastatin  10 mg Oral QHS   Continuous Infusions:   . sodium chloride 20 mL/hr at 04/01/12 0519   PRN Meds:.     Assessment/Plan:  78yo M w/ HTN, HLD, CKD (stage unknown) and DM2 presents with anorexia, weight loss, and anemia presenting with acute on chronic kidney disease as well as UTI  1.  Acute on CKD: Stage 4 per records with baseline creatinine of 2.3 discovered today. Patient states he was told he needed  dialysis in the past but has refused. The patient's creatinine continues to remain elevated at 4.71 however this is improved from yesterday when it was 5.05.  -- Adequate urine output even off of doxazosin. Consider post void residual if urine output decreases -- Continue NS @ 31mL/hr. Likely prerenal element contributing so will continue gentle hydration. No current signs fluid overload but will monitor.  -- treating UTI which could be potential cause  2. UTI and Neutrophilia: Urine culture grew >100,000 gram negative rods which indicates a likely urinary source. The patient had an elevated WBC of 15.3 which has decreased to 14.5. This has trended down from admission when his WBC was 20.0. The patient has remained afebrile throughout this hospitalization. Also concerned that UTI may be source of elevated CBGs  -- Ceftriaxone 2g QD  -- Will start Cefuroxime 250mg  q 12 hours X 7 days total at discharge   3. Weight loss/constipation/fatigue/anorexia: abdominal ultrasound was unremarkable. Since his admission, the patient has  been eating well and finishing all of his meals. Patient was on bupropion at home which could have contributed to his anorexia. This medication has been permanently discontinued.  --dehydration from elevated CBGs could have also been contributing, have rehydrated and attempting to control CBGs -- Continue Remeron  -- CKD baseline Stage IV could be contributing as well -- Anemia from CKD could be contributing as well. Hgb approximately 10 and spoke with Dr. Briant Cedar who would not suggest epo unless hgb significantly below 10.   4. Hyperglycemia: Patient's blood sugars have improved since starting ceftriaxone. Patients most recent blood sugars were 211 and 228 which is down from 400s.    -- Continue Lantus to 20 units daily  -- Moderate SSI  -- likely discharge home on Lantus  6: HTN/HLD: Patient's home lisinopril was held due to elevated creatinine and his home metoprolol was  held due to hypotension. Appears to only be used for primary prevention so have HTN goal of SBP <150, will restart as needed.   7. GERD  -- Continue PPI   8. BPH: doxazosin was held in the setting of hypotension   9. DVT PPx: heparin SQ   10. Dispo: inpatient. Patient to be discharged to [facility] with Palliative care to follow once AKI improved.     11. Code Status: dnr/dni/no dialysis  [name]   MS IV            Family Medicine Upper Level Addendum:   I have seen and examined the patient independently, discussed with [name], MSIV fully reviewed the progress noted and agree with it's contents with the additions as noted in blue text. My independent exam is below.   BP 128/64  Pulse 74  Temp 97.5 F (36.4 C) (Oral)  Resp 18  Ht 5' 8.9" (1.75 m)  Wt 139 lb 12.4 oz (63.4 kg)  BMI 20.70 kg/m2  SpO2 97% Gen: NAD, resting comfortably in bed HEENT: NCAT, MMM, PERRLA  CV: RRR no mrg  Lungs: CTAB  Abd: soft/nontender/nondistended/normal bowel sounds  MSK: moves all extremities, no edema  Skin: warm and dry, no rash  Neuro: grossly intact.   With addition of records, we have now determined that patient has acute on chronic kidney disease so will restart fluids and follow fluid status. UTI as potential cause of WBC, elevated sugars, and ultimately acute kidney injury. Will continue to treat with IV antibiotics at this time.   Appreciate palliative care input today to help patient feel more comfortable.    Tana Conch, MD, PGY2 04/01/2012 2:13 PM

## 2012-04-01 NOTE — Consult Note (Addendum)
Patient Mario Turner      DOB: 09/05/1927      VWU:[id number]     Consult Note from the Palliative Medicine Team at [facility]    Consult Requested by: Family medicine     PCP: No primary provider on file. Reason for Consultation: Goals of care     Phone Number:None  Assessment of patients Current state: 78 y/o male with h/o CKD, hypertension, diabetes mellitus who has been admitted with dehydration, UTI. Patient is stable at this time, discussed in detail regarding code status, comfort vs aggressive care, dialysis option.  Recommendations; 1. Patient is DNR/DNI 2. Continue current care 3. MOST form filled, he does not want to return to hospital 4. Wants to go to [facility] home with palliative care follow up.  Goals of Care: 1.  Code Status: DNR/DNI   2. Scope of Treatment: 1. Vital Signs: per protocol  2. Respiratory/Oxygen: Nasal canula as needed 3. Nutritional Support/Tube Feeds: No peg or panda tube 4. Antibiotics: Continue antibiotics 5. Blood Products: No blood transfusion  6. IVF: Continue IVF for now 7. Review of Medications to be discontinued: D/C Aspirin, Zocor 8. Labs: Continue as needed 9. Consults: No further consults at this time   4. Disposition: SNF with palliative care folllow up   3. Symptom Management:   1. Anxiety/Agitation: ativan 1 mg po q 4 hr prn 2. Pain: Oxycodone 5 mg po Q 4 hr prn 3. Bowel Regimen: senakot S One po daily at bedtime 4. Fever: Tylenol prn 5. Nausea/Vomiting: zofran prn 6. Terminal Secretions: Atropine sublingual prn 7. Anorexia: Continue Remeron as scheduled  4. Psychosocial: Patient wants to go to [place] home.  5. Spiritual: Patient is at peace with his terminal diagnosis, and very accepting of his death.        Patient Documents Completed or Given: Document Given Completed  Advanced Directives Pkt    MOST       X   DNR    Gone from My Sight    Hard Choices      Brief HPI: 78 y/o male with h/o  hypertension, diabetes mellitus, CKD stage IV, who was sent to the hospital from [facility]. Patient has been on road trip for last 1.5 months, complained of low appetite, weight loss, was found to be in acute on chronic kidney disease, UTI with 100,000 colonies of e coli. Patient is currently on ceftriaxone. Patient has refused dialysis in past, and still does not want to be on dialysis. Goals of care meeting done with patient, his son and daughter in law at the bedside.  ROS: Anxiety- denies Dyspnea- denies Constipation- positive Anorexia- positive Fatigue- positive Nausea/Vomiting- denies    PMH:  Past Medical History  Diagnosis Date  . Diabetes mellitus without complication   . Chronic kidney disease   . Chronic kidney disease, stage V 03/29/2012     PSH: Past Surgical History  Procedure Date  . Appendectomy   . Eye surgery   . Tonsillectomy   . Colon surgery   . Diagnostic laparoscopy    I have reviewed the FH and SH and  If appropriate update it with new information. No Known Allergies Scheduled Meds:   . cefTRIAXone (ROCEPHIN)  IV  2 g Intravenous Q24H  . feeding supplement  237 mL Oral TID BM  . heparin  5,000 Units Subcutaneous Q8H  . influenza  inactive virus vaccine  0.5 mL Intramuscular Tomorrow-1000  . insulin aspart  0-15 Units Subcutaneous  TID WC  . insulin aspart  10 Units Subcutaneous Once  . insulin glargine  20 Units Subcutaneous Daily  . mirtazapine  7.5 mg Oral QHS  . pantoprazole  40 mg Oral Q1200  . DISCONTD: aspirin  81 mg Oral Daily  . DISCONTD: insulin aspart  0-9 Units Subcutaneous TID WC  . DISCONTD: simvastatin  10 mg Oral QHS   Continuous Infusions:   . sodium chloride 20 mL/hr at 04/01/12 0519   PRN Meds:.    BP 128/64  Pulse 74  Temp 97.5 F (36.4 C) (Oral)  Resp 18  Ht 5' 8.9" (1.75 m)  Wt 63.4 kg (139 lb 12.4 oz)  BMI 20.70 kg/m2  SpO2 97%      Intake/Output Summary (Last 24 hours) at 04/01/12 0929 Last data  filed at 04/01/12 0519  Gross per 24 hour  Intake 1233.58 ml  Output    375 ml  Net 858.58 ml     Physical Exam:  General: appear in no distress HEENT:  NCAT, oral mucosa is moist Chest: Clear bilaterally CVS: S1S2 RRR Abdomen: Soft, nontender Ext: No edema Neuro: Alert, oriented x 3, has decision making capacity  Labs: CBC    Component Value Date/Time   WBC 14.5* 04/01/2012 0610   WBC 25.6* 03/29/2012 1541   RBC 3.07* 04/01/2012 0610   RBC 3.92* 03/29/2012 1541   HGB 9.5* 04/01/2012 0610   HGB 11.7* 03/29/2012 1541   HCT 27.2* 04/01/2012 0610   HCT 36.7* 03/29/2012 1541   PLT 366 04/01/2012 0610   MCV 88.6 04/01/2012 0610   MCV 93.6 03/29/2012 1541   MCH 30.9 04/01/2012 0610   MCH 29.8 03/29/2012 1541   MCHC 34.9 04/01/2012 0610   MCHC 31.9 03/29/2012 1541   RDW 13.2 04/01/2012 0610   LYMPHSABS 0.6* 03/29/2012 2030   MONOABS 1.0 03/29/2012 2030   EOSABS 0.0 03/29/2012 2030   BASOSABS 0.0 03/29/2012 2030    BMET    Component Value Date/Time   NA 130* 04/01/2012 0610   K 4.7 04/01/2012 0610   CL 100 04/01/2012 0610   CO2 18* 04/01/2012 0610   GLUCOSE 233* 04/01/2012 0610   BUN 79* 04/01/2012 0610   CREATININE 4.71* 04/01/2012 0610   CREATININE 5.26* 03/29/2012 1537   CALCIUM 8.4 04/01/2012 0610   GFRNONAA 11* 04/01/2012 0610   GFRAA 12* 04/01/2012 0610    CMP     Component Value Date/Time   NA 130* 04/01/2012 0610   K 4.7 04/01/2012 0610   CL 100 04/01/2012 0610   CO2 18* 04/01/2012 0610   GLUCOSE 233* 04/01/2012 0610   BUN 79* 04/01/2012 0610   CREATININE 4.71* 04/01/2012 0610   CREATININE 5.26* 03/29/2012 1537   CALCIUM 8.4 04/01/2012 0610   PROT 5.4* 03/30/2012 0435   ALBUMIN 2.4* 03/30/2012 0435   AST 7 03/30/2012 0435   ALT 8 03/30/2012 0435   ALKPHOS 85 03/30/2012 0435   BILITOT 0.3 03/30/2012 0435   GFRNONAA 11* 04/01/2012 0610   GFRAA 12* 04/01/2012 0610        Time In Time Out Total Time Spent with Patient Total Overall Time  11: 30   12 45  60 min 75 min    Greater than 50%  of this time was spent counseling and coordinating care related to the above assessment and plan.

## 2012-04-01 NOTE — Progress Notes (Signed)
Clinical Social Work  CSW met with son who reported that he feels patient will need SNF. Per CM, patient's [facility] is primary. CSW called insurance who reported patient has managed Medicare HMO plan. Insurance stated SNF would have to call and discover what out-of-network costs it would be for SNF in [place]. Son still interested in [place] for placement. CSW completed FL2 and faxed out to [facility]. CSW spoke with [facility] who is agreeable to admission tomorrow if all paperwork can be completed. CSW submitted for pasarr for patient.   CSW will continue to follow.  [place], [place] [phone]

## 2012-04-01 NOTE — Progress Notes (Signed)
Clinical Social Work Department BRIEF PSYCHOSOCIAL ASSESSMENT 04/01/2012  Patient:  Mario Turner,Mario Turner     Account Number:  [id number]     Admit date:  03/29/2012  Clinical Social Worker:  Dennison Bulla  Date/Time:  04/01/2012 11:00 AM  Referred by:  Physician  Date Referred:  04/01/2012 Referred for  SNF Placement   Other Referral:   Interview type:  Patient Other interview type:   Son involved    PSYCHOSOCIAL DATA Living Status:  FAMILY Admitted from facility:   Level of care:   Primary support name:  Renae Fickle Primary support relationship to patient:  CHILD, ADULT Degree of support available:   Strong    CURRENT CONCERNS Current Concerns  Post-Acute Placement   Other Concerns:    SOCIAL WORK ASSESSMENT / PLAN CSW received referral from MD reporting that patient is interested in going to [facility]. CSW reviewed chart and spoke with CM regarding patient being observation status. CSW met with son and patient at bedside.    CSW introduced myself and explained role. Patient had just finished PT session and was ambulating well. PT recommending outpt PT. CSW explained that SNF would not be covered under Medicare due to patient not having a qualifying stay. Patient has not been in the hospital in the last 30 days to have a stay during that time as well. Son reports that patient is interested in assisted living. PT confirmed that AL would be appropriate level for patient. Patient is only interested in [facility] at this time. CSW called [facility] and got information regarding patient becoming a resident. CSW met with patient and son and provided information regarding becoming a resident at [facility]. CSW also provided patient and son with ALF list and explained process. At this time, patient and son asked to speak with CM regarding HH services. CSW made referral to CM. Son and patient will discuss options privately and CSW will follow up to determine if any assistance is needed.    Assessment/plan status:  Psychosocial Support/Ongoing Assessment of Needs Other assessment/ plan:   Information/referral to community resources:   ALF list  Information from [facility]    PATIENT'S/FAMILY'S RESPONSE TO PLAN OF CARE: Patient alert and oriented. Patient and son involved in assessment and asked appropriate questions. Patient is still undecided regarding dc plans. Son and patient aware that patient cannot delay dc solely based on decision regarding ALF. Son reports that patient can return home with him until ALF placement.

## 2012-04-01 NOTE — Progress Notes (Signed)
I discussed with Dr Durene Cal and MS IV Peggye Pitt .  I agree with their plans documented in their progress note for today.

## 2012-04-01 NOTE — Progress Notes (Addendum)
Clinical Social Work Department CLINICAL SOCIAL WORK PLACEMENT NOTE 04/01/2012  Patient:  Lafevers,Hiawatha  Account Number:  [id number] Admit date:  03/29/2012  Clinical Social Worker:  Unk Lightning, LCSW  Date/time:  04/01/2012 12:00 N  Clinical Social Work is seeking post-discharge placement for this patient at the following level of care:   SKILLED NURSING   (*CSW will update this form in Epic as items are completed)   04/01/2012  Patient/family provided with [facility] Department of Clinical Social Work's list of facilities offering this level of care within the geographic area requested by the patient (or if unable, by the patient's family).  04/01/2012  Patient/family informed of their freedom to choose among providers that offer the needed level of care, that participate in Medicare, Medicaid or managed care program needed by the patient, have an available bed and are willing to accept the patient.  04/01/2012  Patient/family informed of [facility]' ownership interest in [facility], as well as of the fact that they are under no obligation to receive care at this facility.  PASARR submitted to EDS on 04/01/2012 PASARR number received from EDS on 04/02/12  FL2 transmitted to all facilities in geographic area requested by pt/family on  04/01/2012 FL2 transmitted to all facilities within larger geographic area on   Patient informed that his/her managed care company has contracts with or will negotiate with  certain facilities, including the following:     Patient/family informed of bed offers received:  04/02/12 Patient chooses bed at [facility] Physician recommends and patient [facility]12 Patient to be transferred to facility by Son  The following physician request were entered in Epic:   Additional Comments:  Pt chose ALF placement

## 2012-04-01 NOTE — Progress Notes (Signed)
Physical Therapy Treatment Patient Details Name: Mario Turner MRN: [id number] DOB: 07/25/1927 Today's Date: 04/01/2012 Time: 5284-1324 PT Time Calculation (min): 21 min  PT Assessment / Plan / Recommendation Comments on Treatment Session  Pt admitted with UTI and progressing with mobility with use of RW. Family and pt now interested in ALF directly at discharge and feel this would be a great benefit for pt to decrease burden on family and promote mobility. Will continue to follow to maximize mobility and balance.     Follow Up Recommendations  Home health PT;Other (comment) (ALF)     Does the patient have the potential to tolerate intense rehabilitation     Barriers to Discharge        Equipment Recommendations       Recommendations for Other Services    Frequency     Plan Discharge plan needs to be updated    Precautions / Restrictions Precautions Precautions: Fall   Pertinent Vitals/Pain No pain    Mobility  Bed Mobility Supine to Sit: 6: Modified independent (Device/Increase time);With rails;HOB flat Sitting - Scoot to Edge of Bed: 6: Modified independent (Device/Increase time) Transfers Sit to Stand: 5: Supervision;From bed Stand to Sit: 5: Supervision;With armrests;To chair/3-in-1 Details for Transfer Assistance: cueing for hand placement and safety with transfers Ambulation/Gait Ambulation/Gait Assistance: 5: Supervision Ambulation Distance (Feet): 600 Feet Assistive device: Rolling walker Ambulation/Gait Assistance Details: cueing for safety and position in RW Gait Pattern: Step-through pattern;Decreased stride length Stairs: No    Exercises General Exercises - Lower Extremity Long Arc Quad: AROM;Both;20 reps;Seated Hip Flexion/Marching: AROM;Both;20 reps;Seated   PT Diagnosis:    PT Problem List:   PT Treatment Interventions:     PT Goals Acute Rehab PT Goals Pt will go Sit to Stand: with modified independence PT Goal: Sit to Stand - Progress: Goal  set today Pt will go Stand to Sit: with modified independence PT Goal: Stand to Sit - Progress: Goal set today PT Goal: Ambulate - Progress: Progressing toward goal  Visit Information  Last PT Received On: 04/01/12 Assistance Needed: +1    Subjective Data  Subjective: "I've been so sleepy today" "does it ever stop raining here?"   Cognition  Overall Cognitive Status: Impaired Area of Impairment: Memory;Safety/judgement Arousal/Alertness: Awake/alert Orientation Level: Appears intact for tasks assessed Behavior During Session: [facility] for tasks performed Memory: Decreased recall of precautions Memory Deficits: unable to remember room number and constant need to reinforce appropriate use of RW Safety/Judgement: Decreased safety judgement for tasks assessed    Balance     End of Session PT - End of Session Equipment Utilized During Treatment: Gait belt Activity Tolerance: Patient tolerated treatment well Patient left: in chair;with call bell/phone within reach;with nursing in room;with family/visitor present Nurse Communication: Mobility status   GP     Toney Sang Beth 04/01/2012, 11:13 AM Delaney Meigs, PT [phone]

## 2012-04-01 NOTE — Telephone Encounter (Signed)
Explained to son that Dr Patsy Lager only sees pt at walk-in clinic and how that works. Son stated that his father has just come to town and saw Dr Patsy Lager. Pt is currently in the hosp and may be going to a nursing home at some point, but would like to be able to see Dr Copland in the mean time. Advised son that he can call ahead and find out what her schedule is for the week pt wants to come in.

## 2012-04-02 LAB — RENAL FUNCTION PANEL
Albumin: 2.1 g/dL — ABNORMAL LOW (ref 3.5–5.2)
Calcium: 8 mg/dL — ABNORMAL LOW (ref 8.4–10.5)
GFR calc Af Amer: 13 mL/min — ABNORMAL LOW (ref >90)
GFR calc non Af Amer: 11 mL/min — ABNORMAL LOW (ref >90)
Phosphorus: 2.9 mg/dL (ref 2.3–4.6)
Sodium: 135 mEq/L (ref 135–145)

## 2012-04-02 LAB — GLUCOSE, CAPILLARY: Glucose-Capillary: 201 mg/dL — ABNORMAL HIGH (ref 70–99)

## 2012-04-02 LAB — CBC
MCH: 29.9 pg (ref 26.0–34.0)
MCHC: 34 g/dL (ref 30.0–36.0)
Platelets: 356 10*3/uL (ref 150–400)
RDW: 13 % (ref 11.5–15.5)

## 2012-04-02 MED ORDER — MIRTAZAPINE 7.5 MG PO TABS: 7.5000 mg | ORAL_TABLET | Freq: Every day | ORAL | Status: DC

## 2012-04-02 MED ORDER — INSULIN ASPART 100 UNIT/ML ~~LOC~~ SOLN: 8.0000 [IU] | Freq: Three times a day (TID) | SUBCUTANEOUS | Status: DC

## 2012-04-02 MED ORDER — ONDANSETRON HCL 4 MG PO TABS: 4.0000 mg | ORAL_TABLET | Freq: Three times a day (TID) | ORAL | Status: DC | PRN

## 2012-04-02 MED ORDER — SENNOSIDES-DOCUSATE SODIUM 8.6-50 MG PO TABS: 1.0000 | ORAL_TABLET | Freq: Every day | ORAL | Status: DC

## 2012-04-02 MED ORDER — GLUCERNA SHAKE PO LIQD: 237.0000 mL | Freq: Three times a day (TID) | ORAL | Status: DC

## 2012-04-02 MED ORDER — LEVOFLOXACIN 250 MG PO TABS: 250.0000 mg | ORAL_TABLET | ORAL | Status: DC

## 2012-04-02 MED ORDER — INSULIN GLARGINE 100 UNIT/ML ~~LOC~~ SOLN: 25.0000 [IU] | Freq: Every day | SUBCUTANEOUS | Status: DC

## 2012-04-02 NOTE — Progress Notes (Signed)
Clinical Social Work  CSW met with son who reported he and patient discussed dc plans and patient wants to go to [facility]. CSW informed [facility] of patient's decision. CSW spoke with [facility] who is agreeable to admission today. CSW paged MD and informed MD of dc summary and HH PT/OT/RN and walker needs. CSW will await dc summary and will assist with dc today.  [place], [place] [phone]

## 2012-04-02 NOTE — Progress Notes (Signed)
I discussed with Dr Cook.  I agree with their plans documented in their progress note for today.  

## 2012-04-02 NOTE — Progress Notes (Signed)
Clinical Social Work  CSW sent all dc information including FL2, dc summary and dr orders to [facility]. ALF agreeable to admission today. CSW provided son and patient with dc packet. Son to transport patient to ALF. CSW informed RN that ALF is ready to admit patient. CSW is signing off.  [place], [place] [phone]

## 2012-04-02 NOTE — Progress Notes (Signed)
Family Medicine Teaching Service Daily Progress Note Service Page: [phone]  Subjective:  No overnight events. Feeling well this am. No complaints.  Objective: Temp:  [97.1 F (36.2 C)-99 F (37.2 C)] 98.8 F (37.1 C) (10/11 0600) Pulse Rate:  [64-94] 64  (10/11 0600) Resp:  [18-20] 20  (10/11 0600) BP: (115-130)/(57-70) 130/57 mmHg (10/11 0600) SpO2:  [96 %-99 %] 96 % (10/11 0600) Weight:  [141 lb 12.1 oz (64.3 kg)] 141 lb 12.1 oz (64.3 kg) (10/11 0600)  Exam: General: awake, alert. NAD. Cardiovascular: RRR. 2/6 systolic murmur LLSB Respiratory: CTAB. No rales, rhonchi, or wheeze. Abdomen: soft, nontender, nondistended. Extremities: warm, well perfused. Neuro: No focal deficits.   CBC BMET   Lab 04/02/12 0540 04/01/12 0610 03/31/12 0850  WBC 13.2* 14.5* 15.9*  HGB 9.0* 9.5* 10.1*  HCT 26.5* 27.2* 29.7*  PLT 356 366 403*    Lab 04/02/12 0540 04/01/12 0610 03/31/12 0850  NA 135 130* 128*  K 4.7 4.7 4.9  CL 103 100 96  CO2 21 18* 20  BUN 69* 79* 82*  CREATININE 4.46* 4.71* 4.98*  GLUCOSE 174* 233* 504*  CALCIUM 8.0* 8.4 7.9*     Imaging/Diagnostic Tests:  US Abdomen Complete IMPRESSION: 1.  No evidence of abdominal mass. 2.  Minimal amount of sludge within an otherwise normal-appearing gallbladder. 3.  Sub centimeter anechoic lesion within the left kidney is too small to accurately characterize but favored to represent a renal cyst.   Urine Culture: >100,000 CFU E. Coli (pan-sensitive except for Bactrim). A1C - 8.6  Assessment/Plan: 78yo M w/ HTN, HLD, CKD (stage unknown) and DM2 presents with anorexia, weight loss, and anemia presenting with acute on chronic kidney disease as well as UTI.  # Acute on CKD: Stage 4 per records with baseline creatinine of 2.3.. Patient states he was told he needed dialysis in the past but has refused. - Creatinine Improving with IVF - 4.46 today (5.26 on admission). - Good urine output - Will continue IVF @ 75 mL/hr.  Will  monitor closely for signs/symptoms of volume overload.   # Acute Pyelonephritis - Urine Culture grew > 100,000 CFU of E. Coli (pan sensitive excluding Bactrim) - Will continue IV CTX 2 g daily - Will start Cefuroxime 250mg  q 12 hours X 7 days total at discharge   # Weight loss/constipation/fatigue/anorexia - Multifactorial - abdominal ultrasound was unremarkable. Since his admission, the patient has been eating well and finishing all of his meals.  - Patient was on bupropion at home which could have contributed to his anorexia. This was discontinued. - CKD baseline Stage IV and anemia likely contributing as well  - Will continue Remeron to promote weight gain.  # Hyperglycemia -  Patient's blood sugars have improved since starting ceftriaxone to treat underlying infection. - CBG this am - 201. - Continue Lantus to 25 U and moderate SSI while in house.  May need to be changed to schedule Novolog depending on placement.  # HTN - Appears to only be used for primary prevention so have HTN goal of SBP <150, will restart home meds as needed.  # GERD  - will continue PPI   FEN/GI: NS @ 75 mL hour. Protonix 40 mg daily DVT PPx: heparin SQ  Dispo: Pending continued clinical improvement and placement Code Status: dnr/dni/no dialysis    Everlene Other, DO 04/02/2012, 8:54 AM

## 2012-04-02 NOTE — Progress Notes (Signed)
Physical Therapy Treatment Patient Details Name: Mario Turner MRN: [id number] DOB: January 06, 1928 Today's Date: 04/02/2012 Time: 4098-1191 PT Time Calculation (min): 20 min  PT Assessment / Plan / Recommendation Comments on Treatment Session  Pt with UTI progressing with mobility and RW use and continues to need supervision for transfers and gait for safety. Will continue to follow encouraged ambulation and HEP over weekend.     Follow Up Recommendations        Does the patient have the potential to tolerate intense rehabilitation     Barriers to Discharge        Equipment Recommendations       Recommendations for Other Services    Frequency     Plan Discharge plan remains appropriate;Frequency remains appropriate    Precautions / Restrictions Precautions Precautions: Fall   Pertinent Vitals/Pain No pain    Mobility  Bed Mobility Supine to Sit: 6: Modified independent (Device/Increase time);HOB flat Sitting - Scoot to Edge of Bed: 6: Modified independent (Device/Increase time) Transfers Sit to Stand: 5: Supervision;From bed Stand to Sit: 5: Supervision;To chair/3-in-1;With armrests Details for Transfer Assistance: cueing for hand placement and safety as pt wanting to maintain hands on RW throughout Ambulation/Gait Ambulation/Gait Assistance: 5: Supervision Ambulation Distance (Feet): 650 Feet Assistive device: Rolling walker Ambulation/Gait Assistance Details: cueing for safety and position in RW as well as directional cues to room Gait Pattern: Step-through pattern;Trunk flexed;Decreased stride length Gait velocity: 63ft/13sec=2.3 ft /sec safe for household ambulation    Exercises General Exercises - Lower Extremity Long Arc Quad: AROM;Both;20 reps;Seated Hip Flexion/Marching: AROM;Both;20 reps;Seated   PT Diagnosis:    PT Problem List:   PT Treatment Interventions:     PT Goals Acute Rehab PT Goals PT Goal: Sit to Stand - Progress: Progressing toward goal PT  Goal: Stand to Sit - Progress: Progressing toward goal PT Goal: Ambulate - Progress: Progressing toward goal  Visit Information  Last PT Received On: 04/02/12 Assistance Needed: +1    Subjective Data  Subjective: I've been so cold in here today   Cognition  Overall Cognitive Status: Impaired Area of Impairment: Safety/judgement;Memory Arousal/Alertness: Awake/alert Orientation Level: Appears intact for tasks assessed Behavior During Session: [facility] for tasks performed Memory Deficits: decreased recall of room number Safety/Judgement: Decreased safety judgement for tasks assessed Safety/Judgement - Other Comments: cueing for RW use and safety with entering room pt with RW off to the side and practically tripping over RW and IV with cues to correct    Balance     End of Session PT - End of Session Activity Tolerance: Patient tolerated treatment well Patient left: in chair;with call bell/phone within reach;with family/visitor present   GP     Toney Sang Beth 04/02/2012, 9:52 AM Delaney Meigs, PT [phone]

## 2012-04-02 NOTE — Progress Notes (Signed)
Clinical Social Work  CSW spoke with son regarding plans. Son reports that he has additional questions regarding SNF vs ALF. Son is interested in [facility] but also reports he is interested in [facility]. CSW called [facility] who is still working with insurance to determine if insurance will pay for stay. CSW called [facility] and left a message for admissions coordinator. CSW will continue to follow.  [place], [place] [phone]

## 2012-04-06 LAB — METHYLMALONIC ACID(MMA), RND URINE
Methylmalonic Acid, Random Urine: 0.36 mmol/mol{creat} (ref <3.60)
Methylmalonic Acid, Ur: 1.35 umol/L

## 2012-04-06 NOTE — Care Management Note (Signed)
    Page 1 of 1   04/06/2012     6:31:17 PM   CARE MANAGEMENT NOTE 04/06/2012  Patient:  Mario Turner,Mario Turner   Account Number:  [id number]  Date Initiated:  04/06/2012  Documentation initiated by:  Letha Cape  Subjective/Objective Assessment:   admit     Action/Plan:   Anticipated DC Date:  04/02/2012   Anticipated DC Plan:  ASSISTED LIVING / REST HOME  In-house referral  Clinical Social Worker      DC Planning Services  CM consult      Choice offered to / List presented to:             Status of service:  Completed, signed off Medicare Important Message given?   (If response is "NO", the following Medicare IM given date fields will be blank) Date Medicare IM given:   Date Additional Medicare IM given:    Discharge Disposition:  ASSISTED LIVING  Per UR Regulation:  Reviewed for med. necessity/level of care/duration of stay  If discussed at Long Length of Stay Meetings, dates discussed:    Comments:

## 2013-12-16 ENCOUNTER — Observation Stay (HOSPITAL_COMMUNITY)
Admission: EM | Admit: 2013-12-16 | Discharge: 2013-12-19 | DRG: 683 | Disposition: A | Discharge status: Home / Self Care | Payer: Medicare Other | Attending: Internal Medicine | Admitting: Internal Medicine

## 2013-12-16 ENCOUNTER — Encounter (HOSPITAL_COMMUNITY): Payer: Self-pay | Admitting: Emergency Medicine

## 2013-12-16 DIAGNOSIS — Z794 Long term (current) use of insulin: Secondary | ICD-10-CM | POA: Diagnosis not present

## 2013-12-16 DIAGNOSIS — E872 Acidosis, unspecified: Secondary | ICD-10-CM | POA: Diagnosis present

## 2013-12-16 DIAGNOSIS — E1129 Type 2 diabetes mellitus with other diabetic kidney complication: Secondary | ICD-10-CM | POA: Diagnosis not present

## 2013-12-16 DIAGNOSIS — N039 Chronic nephritic syndrome with unspecified morphologic changes: Secondary | ICD-10-CM

## 2013-12-16 DIAGNOSIS — I120 Hypertensive chronic kidney disease with stage 5 chronic kidney disease or end stage renal disease: Principal | ICD-10-CM | POA: Diagnosis present

## 2013-12-16 DIAGNOSIS — D631 Anemia in chronic kidney disease: Secondary | ICD-10-CM | POA: Diagnosis not present

## 2013-12-16 DIAGNOSIS — N185 Chronic kidney disease, stage 5: Secondary | ICD-10-CM

## 2013-12-16 DIAGNOSIS — E1121 Type 2 diabetes mellitus with diabetic nephropathy: Secondary | ICD-10-CM

## 2013-12-16 DIAGNOSIS — I10 Essential (primary) hypertension: Secondary | ICD-10-CM

## 2013-12-16 DIAGNOSIS — E875 Hyperkalemia: Secondary | ICD-10-CM | POA: Diagnosis not present

## 2013-12-16 DIAGNOSIS — Z87891 Personal history of nicotine dependence: Secondary | ICD-10-CM

## 2013-12-16 DIAGNOSIS — E119 Type 2 diabetes mellitus without complications: Secondary | ICD-10-CM | POA: Diagnosis present

## 2013-12-16 DIAGNOSIS — E1165 Type 2 diabetes mellitus with hyperglycemia: Secondary | ICD-10-CM | POA: Diagnosis present

## 2013-12-16 DIAGNOSIS — R739 Hyperglycemia, unspecified: Secondary | ICD-10-CM

## 2013-12-16 DIAGNOSIS — N058 Unspecified nephritic syndrome with other morphologic changes: Secondary | ICD-10-CM

## 2013-12-16 DIAGNOSIS — Z79899 Other long term (current) drug therapy: Secondary | ICD-10-CM

## 2013-12-16 DIAGNOSIS — N189 Chronic kidney disease, unspecified: Secondary | ICD-10-CM

## 2013-12-16 DIAGNOSIS — R7309 Other abnormal glucose: Secondary | ICD-10-CM

## 2013-12-16 DIAGNOSIS — N179 Acute kidney failure, unspecified: Secondary | ICD-10-CM

## 2013-12-16 DIAGNOSIS — E878 Other disorders of electrolyte and fluid balance, not elsewhere classified: Secondary | ICD-10-CM

## 2013-12-16 HISTORY — DX: Essential (primary) hypertension: I10

## 2013-12-16 HISTORY — DX: Anemia in chronic kidney disease: D63.1

## 2013-12-16 HISTORY — DX: Chronic kidney disease, unspecified: N18.9

## 2013-12-16 LAB — BASIC METABOLIC PANEL
BUN: 65 mg/dL — ABNORMAL HIGH (ref 6–23)
CHLORIDE: 103 meq/L (ref 96–112)
CO2: 16 meq/L — AB (ref 19–32)
Calcium: 7.2 mg/dL — ABNORMAL LOW (ref 8.4–10.5)
Creatinine, Ser: 4.78 mg/dL — ABNORMAL HIGH (ref 0.50–1.35)
GFR calc non Af Amer: 10 mL/min — ABNORMAL LOW (ref >90)
GFR, EST AFRICAN AMERICAN: 12 mL/min — AB (ref >90)
Glucose, Bld: 317 mg/dL — ABNORMAL HIGH (ref 70–99)
POTASSIUM: 5.7 meq/L — AB (ref 3.7–5.3)
Sodium: 135 mEq/L — ABNORMAL LOW (ref 137–147)

## 2013-12-16 LAB — HEPATIC FUNCTION PANEL
ALBUMIN: 3.7 g/dL (ref 3.5–5.2)
ALT: 22 U/L (ref 0–53)
AST: 23 U/L (ref 0–37)
Alkaline Phosphatase: 141 U/L — ABNORMAL HIGH (ref 39–117)
Bilirubin, Direct: <0.2 mg/dL (ref 0.0–0.3)
TOTAL PROTEIN: 6.5 g/dL (ref 6.0–8.3)

## 2013-12-16 LAB — URINALYSIS, ROUTINE W REFLEX MICROSCOPIC
Bilirubin Urine: NEGATIVE
Glucose, UA: >1000 mg/dL — AB
Ketones, ur: NEGATIVE mg/dL
LEUKOCYTES UA: NEGATIVE
NITRITE: NEGATIVE
PH: 6 (ref 5.0–8.0)
Protein, ur: 30 mg/dL — AB
SPECIFIC GRAVITY, URINE: 1.016 (ref 1.005–1.030)
Urobilinogen, UA: 0.2 mg/dL (ref 0.0–1.0)

## 2013-12-16 LAB — CBC
HCT: 24.9 % — ABNORMAL LOW (ref 39.0–52.0)
Hemoglobin: 8.7 g/dL — ABNORMAL LOW (ref 13.0–17.0)
MCH: 31.3 pg (ref 26.0–34.0)
MCHC: 34.9 g/dL (ref 30.0–36.0)
MCV: 89.6 fL (ref 78.0–100.0)
PLATELETS: 172 10*3/uL (ref 150–400)
RBC: 2.78 MIL/uL — AB (ref 4.22–5.81)
RDW: 12.9 % (ref 11.5–15.5)
WBC: 4.7 10*3/uL (ref 4.0–10.5)

## 2013-12-16 LAB — URINE MICROSCOPIC-ADD ON

## 2013-12-16 LAB — MAGNESIUM: MAGNESIUM: 1.9 mg/dL (ref 1.5–2.5)

## 2013-12-16 MED ORDER — METOPROLOL TARTRATE 50 MG PO TABS
50.0000 mg | ORAL_TABLET | Freq: Two times a day (BID) | ORAL | Status: DC
  Administered 2013-12-16 – 2013-12-17 (×2): 50 mg via ORAL
  Filled 2013-12-16 (×3): qty 1

## 2013-12-16 MED ORDER — OXYCODONE HCL 5 MG PO TABS
5.0000 mg | ORAL_TABLET | ORAL | Status: DC | PRN
  Filled 2013-12-16: qty 1

## 2013-12-16 MED ORDER — HYDROMORPHONE HCL PF 1 MG/ML IJ SOLN: 0.5000 mg | INTRAMUSCULAR | Status: DC | PRN

## 2013-12-16 MED ORDER — ONDANSETRON HCL 4 MG PO TABS: 4.0000 mg | ORAL_TABLET | Freq: Four times a day (QID) | ORAL | Status: DC | PRN

## 2013-12-16 MED ORDER — ACETAMINOPHEN 650 MG RE SUPP: 650.0000 mg | Freq: Four times a day (QID) | RECTAL | Status: DC | PRN

## 2013-12-16 MED ORDER — MIRTAZAPINE 15 MG PO TABS
15.0000 mg | ORAL_TABLET | Freq: Every day | ORAL | Status: DC
  Administered 2013-12-16 – 2013-12-18 (×3): 15 mg via ORAL
  Filled 2013-12-16 (×4): qty 1

## 2013-12-16 MED ORDER — SODIUM CHLORIDE 0.9 % IJ SOLN: 3.0000 mL | INTRAMUSCULAR | Status: DC | PRN

## 2013-12-16 MED ORDER — ALUM & MAG HYDROXIDE-SIMETH 200-200-20 MG/5ML PO SUSP: 30.0000 mL | Freq: Four times a day (QID) | ORAL | Status: DC | PRN

## 2013-12-16 MED ORDER — SODIUM CHLORIDE 0.9 % IJ SOLN
3.0000 mL | Freq: Two times a day (BID) | INTRAMUSCULAR | Status: DC
  Administered 2013-12-16 – 2013-12-19 (×5): 3 mL via INTRAVENOUS

## 2013-12-16 MED ORDER — SODIUM CHLORIDE 0.9 % IJ SOLN
3.0000 mL | Freq: Two times a day (BID) | INTRAMUSCULAR | Status: DC
  Administered 2013-12-17: 3 mL via INTRAVENOUS

## 2013-12-16 MED ORDER — SODIUM CHLORIDE 0.9 % IV SOLN
1.0000 g | Freq: Once | INTRAVENOUS | Status: AC
  Administered 2013-12-16: 1 g via INTRAVENOUS
  Filled 2013-12-16: qty 10

## 2013-12-16 MED ORDER — SODIUM BICARBONATE 650 MG PO TABS
325.0000 mg | ORAL_TABLET | Freq: Three times a day (TID) | ORAL | Status: DC
  Administered 2013-12-16 – 2013-12-19 (×9): 325 mg via ORAL
  Filled 2013-12-16 (×10): qty 0.5

## 2013-12-16 MED ORDER — SODIUM CHLORIDE 0.9 % IV SOLN: 250.0000 mL | INTRAVENOUS | Status: DC | PRN

## 2013-12-16 MED ORDER — ENOXAPARIN SODIUM 30 MG/0.3ML ~~LOC~~ SOLN
30.0000 mg | SUBCUTANEOUS | Status: DC
  Administered 2013-12-16 – 2013-12-18 (×3): 30 mg via SUBCUTANEOUS
  Filled 2013-12-16 (×4): qty 0.3

## 2013-12-16 MED ORDER — ONDANSETRON HCL 4 MG/2ML IJ SOLN: 4.0000 mg | Freq: Four times a day (QID) | INTRAMUSCULAR | Status: DC | PRN

## 2013-12-16 MED ORDER — CALCIUM GLUCONATE 10 % IV SOLN
1.0000 g | Freq: Once | INTRAVENOUS | Status: AC
  Administered 2013-12-16: 1 g via INTRAVENOUS
  Filled 2013-12-16: qty 10

## 2013-12-16 MED ORDER — SODIUM POLYSTYRENE SULFONATE 15 GM/60ML PO SUSP
30.0000 g | Freq: Once | ORAL | Status: AC
  Administered 2013-12-16: 30 g via ORAL
  Filled 2013-12-16: qty 120

## 2013-12-16 MED ORDER — VITAMIN D3 25 MCG (1000 UNIT) PO TABS
1000.0000 [IU] | ORAL_TABLET | Freq: Every day | ORAL | Status: DC
  Administered 2013-12-17 – 2013-12-19 (×3): 1000 [IU] via ORAL
  Filled 2013-12-16 (×3): qty 1

## 2013-12-16 MED ORDER — ACETAMINOPHEN 325 MG PO TABS: 650.0000 mg | ORAL_TABLET | Freq: Four times a day (QID) | ORAL | Status: DC | PRN

## 2013-12-16 MED ORDER — FUROSEMIDE 20 MG PO TABS
20.0000 mg | ORAL_TABLET | Freq: Every day | ORAL | Status: DC
  Administered 2013-12-16 – 2013-12-19 (×4): 20 mg via ORAL
  Filled 2013-12-16 (×4): qty 1

## 2013-12-16 MED ORDER — DOXAZOSIN MESYLATE 2 MG PO TABS
2.0000 mg | ORAL_TABLET | Freq: Every day | ORAL | Status: DC
  Administered 2013-12-16 – 2013-12-18 (×3): 2 mg via ORAL
  Filled 2013-12-16 (×4): qty 1

## 2013-12-16 NOTE — ED Notes (Signed)
Pt sent from pcp for calcium infusion. Calcium level checked at pcp and was 7.6.  Pt denies pain.

## 2013-12-16 NOTE — ED Notes (Signed)
Pt.'s son Arnav Cregg contact number : [phone].

## 2013-12-16 NOTE — ED Provider Notes (Signed)
CSN: [id number]     Arrival date & time 12/16/13  1609 History   First MD Initiated Contact with Patient 12/16/13 1831     Chief Complaint  Patient presents with  . Abnormal Lab  . low calcium level      (Consider location/radiation/quality/duration/timing/severity/associated sxs/prior Treatment) HPI Comments: Patient with history of chronic kidney disease, diabetes -- presents from PCP with hypocalcemia. Patient has not had this in the past. He is currently asymptomatic from this standpoint. He has had no muscle pain or rigidity. He has no tingling or numbness in his extremities. No reported history of seizures. No chest pain or shortness of breath. No calcium supplementation. He does take Vitamin D supplementation. The onset of this condition was acute. The course is constant. Aggravating factors: none. Alleviating factors: none.    The history is provided by the patient and medical records.    Past Medical History  Diagnosis Date  . Diabetes mellitus without complication   . Chronic kidney disease   . Chronic kidney disease, stage V 03/29/2012   Past Surgical History  Procedure Laterality Date  . Appendectomy    . Eye surgery    . Tonsillectomy    . Colon surgery    . Diagnostic laparoscopy     History reviewed. No pertinent family history. History  Substance Use Topics  . Smoking status: Former Smoker    Quit date: 06/23/1978  . Smokeless tobacco: Not on file  . Alcohol Use: No    Review of Systems  Constitutional: Negative for fever.  HENT: Negative for rhinorrhea and sore throat.   Eyes: Negative for redness.  Respiratory: Negative for cough.   Cardiovascular: Negative for chest pain.  Gastrointestinal: Negative for nausea, vomiting, abdominal pain and diarrhea.  Genitourinary: Negative for dysuria.  Musculoskeletal: Negative for myalgias.  Skin: Negative for rash.  Neurological: Negative for headaches.   Allergies  Review of patient's allergies indicates no  known allergies.  Home Medications   Prior to Admission medications   Medication Sig Start Date End Date Taking? Authorizing Provider  cholecalciferol (VITAMIN D) 1000 UNITS tablet Take 1,000 Units by mouth daily.   Yes Historical Provider, MD  doxazosin (CARDURA) 2 MG tablet Take 2 mg by mouth at bedtime.   Yes Historical Provider, MD  furosemide (LASIX) 20 MG tablet Take 20 mg by mouth daily.   Yes Historical Provider, MD  metoprolol (LOPRESSOR) 50 MG tablet Take 50 mg by mouth 2 (two) times daily.   Yes Historical Provider, MD  mirtazapine (REMERON) 15 MG tablet Take 15 mg by mouth at bedtime.   Yes Historical Provider, MD  sodium bicarbonate 325 MG tablet Take 325 mg by mouth 3 (three) times daily.   Yes Historical Provider, MD   BP 173/75  Pulse 74  Temp(Src) 98 F (36.7 C) (Oral)  Resp 15  SpO2 95%  Physical Exam  Nursing note and vitals reviewed. Constitutional: He appears well-developed and well-nourished.  HENT:  Head: Normocephalic and atraumatic.  Eyes: Conjunctivae are normal. Right eye exhibits no discharge. Left eye exhibits no discharge.  Neck: Normal range of motion. Neck supple.  Cardiovascular: Normal rate, regular rhythm and normal heart sounds.   Pulmonary/Chest: Effort normal and breath sounds normal.  Abdominal: Soft. There is no tenderness.  Neurological: He is alert.  Skin: Skin is warm and dry.  Psychiatric: He has a normal mood and affect.    ED Course  Procedures (including critical care time) Labs Review Labs Reviewed  CBC - Abnormal; Notable for the following:    RBC 2.78 (*)    Hemoglobin 8.7 (*)    HCT 24.9 (*)    All other components within normal limits  BASIC METABOLIC PANEL - Abnormal; Notable for the following:    Sodium 135 (*)    Potassium 5.7 (*)    CO2 16 (*)    Glucose, Bld 317 (*)    BUN 65 (*)    Creatinine, Ser 4.78 (*)    Calcium 7.2 (*)    GFR calc non Af Amer 10 (*)    GFR calc Af Amer 12 (*)    All other components  within normal limits  URINALYSIS, ROUTINE W REFLEX MICROSCOPIC - Abnormal; Notable for the following:    Glucose, UA >1000 (*)    Hgb urine dipstick SMALL (*)    Protein, ur 30 (*)    All other components within normal limits  URINE MICROSCOPIC-ADD ON  HEPATIC FUNCTION PANEL    Imaging Review No results found.   EKG Interpretation None      6:40 PM Patient seen and examined. Work-up initiated. Medications ordered.   Vital signs reviewed and are as follows: Filed Vitals:   12/16/13 1859  BP: 173/75  Pulse: 74  Temp: 98 F (36.7 C)  Resp: 15   8:32 PM Patient discussed with and seen by Dr. Audie Pinto. Feel patient warrants inpatient admission for correction of elevated calcium and potassium. IV calcium and Kayexalate given. EKG shows prolonged PR interval without other changes.    Date: 12/16/2013  Rate: 72  Rhythm: normal sinus rhythm  QRS Axis: normal  Intervals: PR prolonged  ST/T Wave abnormalities: normal  Conduction Disutrbances:none  Narrative Interpretation:   Old EKG Reviewed: none available  Spoke with Dr. Arnoldo Morale who will see patient. Per patient wishes, he will not be a candidate for hemodialysis.    MDM   Final diagnoses:  Hyperkalemia  Hypocalcemia  Chronic kidney disease, stage V   Admit for above.     Carlisle Cater, PA-C 12/16/13 2218

## 2013-12-17 ENCOUNTER — Encounter (HOSPITAL_COMMUNITY): Payer: Self-pay | Admitting: Internal Medicine

## 2013-12-17 DIAGNOSIS — I10 Essential (primary) hypertension: Secondary | ICD-10-CM

## 2013-12-17 DIAGNOSIS — E872 Acidosis, unspecified: Secondary | ICD-10-CM | POA: Diagnosis present

## 2013-12-17 DIAGNOSIS — N189 Chronic kidney disease, unspecified: Secondary | ICD-10-CM

## 2013-12-17 DIAGNOSIS — D631 Anemia in chronic kidney disease: Secondary | ICD-10-CM

## 2013-12-17 DIAGNOSIS — E878 Other disorders of electrolyte and fluid balance, not elsewhere classified: Secondary | ICD-10-CM | POA: Diagnosis present

## 2013-12-17 HISTORY — DX: Anemia in chronic kidney disease: D63.1

## 2013-12-17 HISTORY — DX: Chronic kidney disease, unspecified: N18.9

## 2013-12-17 HISTORY — DX: Essential (primary) hypertension: I10

## 2013-12-17 LAB — HEMOGLOBIN A1C
HEMOGLOBIN A1C: 8.2 % — AB (ref <5.7)
MEAN PLASMA GLUCOSE: 189 mg/dL — AB (ref <117)

## 2013-12-17 LAB — COMPREHENSIVE METABOLIC PANEL
ALBUMIN: 3.3 g/dL — AB (ref 3.5–5.2)
ALT: 21 U/L (ref 0–53)
AST: 20 U/L (ref 0–37)
Alkaline Phosphatase: 123 U/L — ABNORMAL HIGH (ref 39–117)
BUN: 62 mg/dL — ABNORMAL HIGH (ref 6–23)
CHLORIDE: 104 meq/L (ref 96–112)
CO2: 17 mEq/L — ABNORMAL LOW (ref 19–32)
CREATININE: 4.86 mg/dL — AB (ref 0.50–1.35)
Calcium: 7.5 mg/dL — ABNORMAL LOW (ref 8.4–10.5)
GFR calc Af Amer: 12 mL/min — ABNORMAL LOW (ref >90)
GFR calc non Af Amer: 10 mL/min — ABNORMAL LOW (ref >90)
Glucose, Bld: 173 mg/dL — ABNORMAL HIGH (ref 70–99)
POTASSIUM: 5.2 meq/L (ref 3.7–5.3)
Sodium: 136 mEq/L — ABNORMAL LOW (ref 137–147)
TOTAL PROTEIN: 5.8 g/dL — AB (ref 6.0–8.3)
Total Bilirubin: <0.2 mg/dL — ABNORMAL LOW (ref 0.3–1.2)

## 2013-12-17 LAB — GLUCOSE, CAPILLARY
GLUCOSE-CAPILLARY: 111 mg/dL — AB (ref 70–99)
GLUCOSE-CAPILLARY: 140 mg/dL — AB (ref 70–99)
GLUCOSE-CAPILLARY: 167 mg/dL — AB (ref 70–99)
Glucose-Capillary: 192 mg/dL — ABNORMAL HIGH (ref 70–99)

## 2013-12-17 LAB — CBC
HCT: 23.8 % — ABNORMAL LOW (ref 39.0–52.0)
Hemoglobin: 8.1 g/dL — ABNORMAL LOW (ref 13.0–17.0)
MCH: 30.9 pg (ref 26.0–34.0)
MCHC: 34 g/dL (ref 30.0–36.0)
MCV: 90.8 fL (ref 78.0–100.0)
PLATELETS: 137 10*3/uL — AB (ref 150–400)
RBC: 2.62 MIL/uL — ABNORMAL LOW (ref 4.22–5.81)
RDW: 13 % (ref 11.5–15.5)
WBC: 4.4 10*3/uL (ref 4.0–10.5)

## 2013-12-17 LAB — PHOSPHORUS: Phosphorus: 5.4 mg/dL — ABNORMAL HIGH (ref 2.3–4.6)

## 2013-12-17 LAB — IRON AND TIBC
IRON: 58 ug/dL (ref 42–135)
Saturation Ratios: 24 % (ref 20–55)
TIBC: 241 ug/dL (ref 215–435)
UIBC: 183 ug/dL (ref 125–400)

## 2013-12-17 LAB — POTASSIUM: Potassium: 5.4 mEq/L — ABNORMAL HIGH (ref 3.7–5.3)

## 2013-12-17 LAB — VITAMIN B12: Vitamin B-12: 306 pg/mL (ref 211–911)

## 2013-12-17 LAB — FERRITIN: FERRITIN: 59 ng/mL (ref 22–322)

## 2013-12-17 LAB — RETICULOCYTES
RBC.: 2.65 MIL/uL — AB (ref 4.22–5.81)
RETIC COUNT ABSOLUTE: 18.6 10*3/uL — AB (ref 19.0–186.0)
Retic Ct Pct: 0.7 % (ref 0.4–3.1)

## 2013-12-17 LAB — FOLATE: Folate: 14.8 ng/mL

## 2013-12-17 MED ORDER — INSULIN ASPART 100 UNIT/ML ~~LOC~~ SOLN
0.0000 [IU] | Freq: Three times a day (TID) | SUBCUTANEOUS | Status: DC
  Administered 2013-12-17: 2 [IU] via SUBCUTANEOUS
  Administered 2013-12-17: 1 [IU] via SUBCUTANEOUS
  Administered 2013-12-18 (×3): 2 [IU] via SUBCUTANEOUS
  Administered 2013-12-19: 3 [IU] via SUBCUTANEOUS
  Administered 2013-12-19: 1 [IU] via SUBCUTANEOUS

## 2013-12-17 MED ORDER — INSULIN GLARGINE 100 UNIT/ML ~~LOC~~ SOLN
5.0000 [IU] | Freq: Every day | SUBCUTANEOUS | Status: DC
  Administered 2013-12-17 – 2013-12-18 (×2): 5 [IU] via SUBCUTANEOUS
  Filled 2013-12-17 (×3): qty 0.05

## 2013-12-17 MED ORDER — INSULIN ASPART 100 UNIT/ML ~~LOC~~ SOLN: 0.0000 [IU] | Freq: Every day | SUBCUTANEOUS | Status: DC

## 2013-12-17 MED ORDER — SODIUM POLYSTYRENE SULFONATE 15 GM/60ML PO SUSP
30.0000 g | Freq: Once | ORAL | Status: AC
  Administered 2013-12-17: 30 g via ORAL
  Filled 2013-12-17: qty 120

## 2013-12-17 NOTE — H&P (Addendum)
[facility] History and Physical  Jaymie Misch HAL:[id number] DOB: [birth year] DOA: 12/16/2013  Referring physician:  EDP PCP: Lilian Coma, MD  Specialists:   Chief Complaint:   HPI: Mario Turner is a 80 y.o. male with a history of Stage V CKD not on diialysis rx who was sent to the ED after bloodwork returned with abnormal values.  His calcium level was low and in the ED he was also found to have an elevated potassium level.   His BUN/Cr was  65/4.78.   He is decided many years ago that he would not undergo dialysis treatment and he remains true to his decision today.  He was referred for admission for his electrolyte disorder.     Review of Systems:  Constitutional: No Weight Loss, No Weight Gain, Night Sweats, Fevers, Chills, Fatigue,  +Generalized Weakness HEENT: No Headaches, Difficulty Swallowing,Tooth/Dental Problems,Sore Throat,  No Sneezing, Rhinitis, Ear Ache, Nasal Congestion, or Post Nasal Drip,  Cardio-vascular:  No Chest pain, Orthopnea, PND, Edema in lower extremities, Anasarca, Dizziness, Palpitations  Resp: No Dyspnea, No DOE, No Productive Cough, No Non-Productive Cough, No Hemoptysis, No Change in Color of Mucus,  No Wheezing.    GI: No Heartburn, Indigestion, Abdominal Pain, Nausea, Vomiting, Diarrhea, Change in Bowel Habits,  Loss of Appetite  GU: No Dysuria, Change in Color of Urine, No Urgency or Frequency.  No flank pain.  Musculoskeletal: No Joint Pain or Swelling.  No Decreased Range of Motion. No Back Pain.  Neurologic: No Syncope, No Seizures, Muscle Weakness, Paresthesia, Vision Disturbance or Loss, No Diplopia, No Vertigo, No Difficulty Walking,  Skin: No Rash or Lesions. Psych: No Change in Mood or Affect. No Depression or Anxiety. No Memory loss. No Confusion or Hallucinations   Past Medical History  Diagnosis Date  . Diabetes mellitus without complication   . Chronic kidney disease   . Chronic kidney disease, stage V 03/29/2012  . Anemia of  renal disease 12/17/2013  . Benign essential HTN 12/17/2013    Past Surgical History  Procedure Laterality Date  . Appendectomy    . Eye surgery    . Tonsillectomy    . Colon surgery    . Diagnostic laparoscopy       Prior to Admission medications   Medication Sig Start Date End Date Taking? Authorizing Provider  cholecalciferol (VITAMIN D) 1000 UNITS tablet Take 1,000 Units by mouth daily.   Yes Historical Provider, MD  doxazosin (CARDURA) 2 MG tablet Take 2 mg by mouth at bedtime.   Yes Historical Provider, MD  furosemide (LASIX) 20 MG tablet Take 20 mg by mouth daily.   Yes Historical Provider, MD  metoprolol (LOPRESSOR) 50 MG tablet Take 50 mg by mouth 2 (two) times daily.   Yes Historical Provider, MD  mirtazapine (REMERON) 15 MG tablet Take 15 mg by mouth at bedtime.   Yes Historical Provider, MD  sodium bicarbonate 325 MG tablet Take 325 mg by mouth 3 (three) times daily.   Yes Historical Provider, MD    No Known Allergies   Social History:  reports that he quit smoking about 35 years ago. His smoking use included Cigarettes, Pipe, and Cigars. He smoked 0.00 packs per day. He quit smokeless tobacco use about 64 years ago. His smokeless tobacco use included Chew. He reports that he does not drink alcohol or use illicit drugs.     History reviewed. No pertinent family history.     Physical Exam:  GEN:  Pleasant Elderly Obese 80 y.o.  Caucasian male examined  and in no acute distress; cooperative with exam Filed Vitals:   12/16/13 2100 12/16/13 2128 12/16/13 2155 12/17/13 0213  BP:  146/78 148/80 129/66  Pulse: 69 68 67 62  Temp:  97.6 F (36.4 C) 97.7 F (36.5 C) 97.9 F (36.6 C)  TempSrc:  Oral Oral Oral  Resp: 13 17 18 16   Height:   5\' 7"  (1.702 m)   Weight:   73.573 kg (162 lb 3.2 oz)   SpO2: 95% 98% 99% 97%   Blood pressure 129/66, pulse 62, temperature 97.9 F (36.6 C), temperature source Oral, resp. rate 16, height 5\' 7"  (1.702 m), weight 73.573 kg (162 lb  3.2 oz), SpO2 97.00%. PSYCH: He is alert and oriented x4; does not appear anxious does not appear depressed; affect is normal HEENT: Normocephalic and Atraumatic, Mucous membranes pink; PERRLA; EOM intact; Fundi:  Benign;  No scleral icterus, Nares: Patent, Oropharynx: Clear, Fair Dentition, Neck:  FROM, no cervical lymphadenopathy nor thyromegaly or carotid bruit; no JVD; Breasts:: Not examined CHEST WALL: No tenderness CHEST: Normal respiration, clear to auscultation bilaterally HEART: Regular rate and rhythm; no murmurs rubs or gallops BACK: No kyphosis or scoliosis; no CVA tenderness ABDOMEN: Positive Bowel Sounds, Obese, soft non-tender; no masses, no organomegaly Rectal Exam: Not done EXTREMITIES: No cyanosis, clubbing or edema; no ulcerations. Genitalia: not examined PULSES: 2+ and symmetric SKIN: Normal hydration no rash or ulceration CNS:  Alert and Oriented X 4, and No Focal Deficits.    Vascular: pulses palpable throughout    Labs on Admission:  Basic Metabolic Panel:  Recent Labs Lab 12/16/13 1807  NA 135*  K 5.7*  CL 103  CO2 16*  GLUCOSE 317*  BUN 65*  CREATININE 4.78*  CALCIUM 7.2*  MG 1.9   Liver Function Tests:  Recent Labs Lab 12/16/13 1807  AST 23  ALT 22  ALKPHOS 141*  BILITOT <0.2*  PROT 6.5  ALBUMIN 3.7   No results found for this basename: LIPASE, AMYLASE,  in the last 168 hours No results found for this basename: AMMONIA,  in the last 168 hours CBC:  Recent Labs Lab 12/16/13 1807  WBC 4.7  HGB 8.7*  HCT 24.9*  MCV 89.6  PLT 172   Cardiac Enzymes: No results found for this basename: CKTOTAL, CKMB, CKMBINDEX, TROPONINI,  in the last 168 hours  BNP (last 3 results) No results found for this basename: PROBNP,  in the last 8760 hours CBG: No results found for this basename: GLUCAP,  in the last 168 hours  Radiological Exams on Admission: No results found.   EKG: Independently reviewed. Sinus Rhythm wit a 1st Degree AVB.      Assessment/Plan:   80 y.o. male with  Principal Problem:   Electrolyte imbalance Active Problems:   Hyperkalemia   Hypocalcemia   Diabetes mellitus, type II   Chronic kidney disease, stage V   Anemia of renal disease   Benign essential HTN   Metabolic Acidosis   1.   Electrolyte Imbalance- Hyperkalemia, and Hypocalcemia , due to Renal Impairment.     2.   Hyperkalemia-  Due to renal disease.   Kayexalate 30 gram PO x 1  administered in the ED.   Re-check in 6 hours, and  Re-Treat with Kayexalate if K+ > 5.2.    3.   Hypocalcemia-   Replete Calcium with IV Calcium Gluconate, Re-Check Calcium level in AM.  A total of 9.2 meq have been ordered IV.  Albumin level was WNL at 3.7.    4.   DM2-  With Renal complication and Hyperglycemia-   Not on medications currently,  SSI coverage ordered and Check a HbA1c level.     5.   Anemia of Renal Disease-  Send Anemia panel.    6.   HTN-   Continue Lasix , Metoprolol, and Doxazosin Rx, Monitor BPs.    7.  CKD Stage V- on Bicarb 325 mg TID,  Check phosphorus level.    8.  Metabolic Acidosis due to #7.   On bicarb TID , may need more replacement.    9.  DVT Prophylaxis with Lovenox.        Code Status:     FULL CODE  Family Communication:    No Family present Disposition Plan:     Inpatient  Time spent:  [place] [facility] Pager [phone]  If 7PM-7AM, please contact night-coverage [email or web address] Password [facility] 12/17/2013, 2:34 AM

## 2013-12-17 NOTE — Progress Notes (Signed)
80 year old gentleman came in for electrolyete abnormalities, has h/o CKD stage 5. He is currently refusing HD. He is comfortable and denies any new complaints.   Hosie Poisson, MD

## 2013-12-17 NOTE — Progress Notes (Signed)
Report received from C.Doyle Askew, RN.  No change in assessment. Will continue plan of care. Stacey Drain

## 2013-12-17 NOTE — Progress Notes (Signed)
[facility] PROGRESS NOTE  Mario Turner IRJ:[id number] DOB: 11/16/1927 DOA: 12/16/2013 PCP: Lilian Coma, MD  Asses1. Electrolyte Imbalance- Hyperkalemia, and Hypocalcemia , due to Renal Impairment.  2. Hyperkalemia- Due to renal disease. Kayexalate 30 gram PO x 1 administered in the ED. Re-check in 6 hours, and Re-Treat with Kayexalate if K+ > 5.2.  3. Hypocalcemia- Replete Calcium with IV Calcium Gluconate, Re-Check Calcium level in AM. A total of 9.2 meq have been ordered IV. Albumin level was WNL at 3.7.  4. DM2- With Renal complication and Hyperglycemia- Not on medications currently, SSI coverage ordered hgba1c is 8.2.will start him on low dose lantus and SSI.   5. Anemia of Renal Disease- anemia panel reveal iron deficiency .  6. HTN- Continue Lasix , Metoprolol, and Doxazosin Rx, Monitor BPs.  7. CKD Stage V- on Bicarb 325 mg TID, Check phosphorus level.  8. Metabolic Acidosis due to #7. On bicarb TID , may need more replacement.  9. DVT Prophylaxis with Lovenox.  Code Status: FULL CODE  Family Communication: No Family present , spoke to son over the phone.  Disposition Plan: Inpatient   Consultants:  none  Procedures:  none  Antibiotics:  none  HPI/Subjective: No new complaints. Wants to go home.   Objective: Filed Vitals:   12/17/13 1500  BP: 169/84  Pulse: 58  Temp: 98.2 F (36.8 C)  Resp: 16    Intake/Output Summary (Last 24 hours) at 12/17/13 1651 Last data filed at 12/17/13 0752  Gross per 24 hour  Intake    350 ml  Output      0 ml  Net    350 ml   Filed Weights   12/16/13 2155  Weight: 73.573 kg (162 lb 3.2 oz)    Exam:   General:  Alert afebrile comfortable  Cardiovascular: s1s2  Respiratory: ctab  Abdomen: soft NT ND BS+  Musculoskeletal: NO PEDAL EDEMA.   Data Reviewed: Basic Metabolic Panel:  Recent Labs Lab 12/16/13 1807 12/17/13 0325  NA 135* 136*  K 5.7* 5.4*  5.2  CL 103 104  CO2 16* 17*  GLUCOSE 317*  173*  BUN 65* 62*  CREATININE 4.78* 4.86*  CALCIUM 7.2* 7.5*  MG 1.9  --   PHOS  --  5.4*   Liver Function Tests:  Recent Labs Lab 12/16/13 1807 12/17/13 0325  AST 23 20  ALT 22 21  ALKPHOS 141* 123*  BILITOT <0.2* <0.2*  PROT 6.5 5.8*  ALBUMIN 3.7 3.3*   No results found for this basename: LIPASE, AMYLASE,  in the last 168 hours No results found for this basename: AMMONIA,  in the last 168 hours CBC:  Recent Labs Lab 12/16/13 1807 12/17/13 0325  WBC 4.7 4.4  HGB 8.7* 8.1*  HCT 24.9* 23.8*  MCV 89.6 90.8  PLT 172 137*   Cardiac Enzymes: No results found for this basename: CKTOTAL, CKMB, CKMBINDEX, TROPONINI,  in the last 168 hours BNP (last 3 results) No results found for this basename: PROBNP,  in the last 8760 hours CBG:  Recent Labs Lab 12/17/13 0731 12/17/13 1200  GLUCAP 111* 140*    No results found for this or any previous visit (from the past 240 hour(s)).   Studies: No results found.  Scheduled Meds: . cholecalciferol  1,000 Units Oral Daily  . doxazosin  2 mg Oral QHS  . enoxaparin (LOVENOX) injection  30 mg Subcutaneous Q24H  . furosemide  20 mg Oral Daily  . insulin aspart  0-5  Units Subcutaneous QHS  . insulin aspart  0-9 Units Subcutaneous TID WC  . mirtazapine  15 mg Oral QHS  . sodium bicarbonate  325 mg Oral TID  . sodium chloride  3 mL Intravenous Q12H  . sodium chloride  3 mL Intravenous Q12H   Continuous Infusions:   Principal Problem:   Electrolyte imbalance Active Problems:   Diabetes mellitus, type II   Chronic kidney disease, stage V   Hyperkalemia   Hypocalcemia   Anemia of renal disease   Benign essential HTN   Metabolic acidosis    Time spent: 35 minutes    [facility] Pager [phone]. If 7PM-7AM, please contact night-coverage at [email or web address], password [facility] 12/17/2013, 4:51 PM  LOS: 1 day

## 2013-12-18 DIAGNOSIS — E875 Hyperkalemia: Secondary | ICD-10-CM | POA: Diagnosis not present

## 2013-12-18 DIAGNOSIS — Z794 Long term (current) use of insulin: Secondary | ICD-10-CM | POA: Diagnosis not present

## 2013-12-18 DIAGNOSIS — I120 Hypertensive chronic kidney disease with stage 5 chronic kidney disease or end stage renal disease: Secondary | ICD-10-CM | POA: Diagnosis not present

## 2013-12-18 DIAGNOSIS — E1129 Type 2 diabetes mellitus with other diabetic kidney complication: Secondary | ICD-10-CM | POA: Diagnosis not present

## 2013-12-18 DIAGNOSIS — N179 Acute kidney failure, unspecified: Secondary | ICD-10-CM

## 2013-12-18 DIAGNOSIS — N185 Chronic kidney disease, stage 5: Secondary | ICD-10-CM | POA: Diagnosis not present

## 2013-12-18 DIAGNOSIS — E872 Acidosis, unspecified: Secondary | ICD-10-CM | POA: Diagnosis not present

## 2013-12-18 DIAGNOSIS — Z87891 Personal history of nicotine dependence: Secondary | ICD-10-CM | POA: Diagnosis not present

## 2013-12-18 DIAGNOSIS — N189 Chronic kidney disease, unspecified: Secondary | ICD-10-CM

## 2013-12-18 DIAGNOSIS — D631 Anemia in chronic kidney disease: Secondary | ICD-10-CM | POA: Diagnosis not present

## 2013-12-18 DIAGNOSIS — Z79899 Other long term (current) drug therapy: Secondary | ICD-10-CM | POA: Diagnosis not present

## 2013-12-18 LAB — GLUCOSE, CAPILLARY
GLUCOSE-CAPILLARY: 154 mg/dL — AB (ref 70–99)
GLUCOSE-CAPILLARY: 173 mg/dL — AB (ref 70–99)
GLUCOSE-CAPILLARY: 132 mg/dL — AB (ref 70–99)
Glucose-Capillary: 157 mg/dL — ABNORMAL HIGH (ref 70–99)

## 2013-12-18 LAB — BASIC METABOLIC PANEL
BUN: 58 mg/dL — ABNORMAL HIGH (ref 6–23)
CALCIUM: 7.2 mg/dL — AB (ref 8.4–10.5)
CO2: 18 meq/L — AB (ref 19–32)
CREATININE: 4.98 mg/dL — AB (ref 0.50–1.35)
Chloride: 108 mEq/L (ref 96–112)
GFR calc Af Amer: 11 mL/min — ABNORMAL LOW (ref >90)
GFR, EST NON AFRICAN AMERICAN: 10 mL/min — AB (ref >90)
Glucose, Bld: 163 mg/dL — ABNORMAL HIGH (ref 70–99)
Potassium: 4.8 mEq/L (ref 3.7–5.3)
SODIUM: 141 meq/L (ref 137–147)

## 2013-12-18 MED ORDER — SODIUM CHLORIDE 0.9 % IV SOLN
1.0000 g | Freq: Once | INTRAVENOUS | Status: AC
  Administered 2013-12-18: 1 g via INTRAVENOUS
  Filled 2013-12-18: qty 10

## 2013-12-18 NOTE — Consult Note (Signed)
Referring Darshana Curnutt: No ref. Caliana Spires found Primary Care Physician:  Lilian Coma, MD Primary Nephrologist:    Reason for Consultation:  CKD stage 5  Hyperkalemia and anemia  HPI: Mario Turner is a 80 y.o. male with a history of Stage V CKD not on diialysis rx who was sent to the ED after bloodwork returned with abnormal values. His calcium level was low and in the ED he was also found to have an elevated potassium level. His BUN/Cr was 65/4.78. He is decided many years ago that he would not undergo dialysis treatment and he remains true to his decision today. He was referred for admission for his electrolyte disorder.   He has well known CKD stage 5 and has spoken to a nephrologist over the last 10 years and has elected to not proceed with dialysis. At one time he was asked to have an fistula placed and decided against this approach.  His hyperkalemia has resolved with medical  Therapy and blood pressure is controlled. He is anemic and we shall proceed with studies for anemia   Past Medical History  Diagnosis Date  . Diabetes mellitus without complication   . Chronic kidney disease   . Chronic kidney disease, stage V 03/29/2012  . Anemia of renal disease 12/17/2013  . Benign essential HTN 12/17/2013    Past Surgical History  Procedure Laterality Date  . Appendectomy    . Eye surgery    . Tonsillectomy    . Colon surgery    . Diagnostic laparoscopy      Prior to Admission medications   Medication Sig Start Date End Date Taking? Authorizing Genesis Paget  cholecalciferol (VITAMIN D) 1000 UNITS tablet Take 1,000 Units by mouth daily.   Yes Historical Petra Sargeant, MD  doxazosin (CARDURA) 2 MG tablet Take 2 mg by mouth at bedtime.   Yes Historical Rutilio Yellowhair, MD  furosemide (LASIX) 20 MG tablet Take 20 mg by mouth daily.   Yes Historical Maggi Hershkowitz, MD  metoprolol (LOPRESSOR) 50 MG tablet Take 50 mg by mouth 2 (two) times daily.   Yes Historical Aldon Hengst, MD  mirtazapine (REMERON) 15 MG tablet  Take 15 mg by mouth at bedtime.   Yes Historical Yussuf Sawyers, MD  sodium bicarbonate 325 MG tablet Take 325 mg by mouth 3 (three) times daily.   Yes Historical Krishawna Stiefel, MD    Current Facility-Administered Medications  Medication Dose Route Frequency Aiyah Scarpelli Last Rate Last Dose  . 0.9 %  sodium chloride infusion  250 mL Intravenous PRN Theressa Millard, MD      . acetaminophen (TYLENOL) tablet 650 mg  650 mg Oral Q6H PRN Theressa Millard, MD       Or  . acetaminophen (TYLENOL) suppository 650 mg  650 mg Rectal Q6H PRN Theressa Millard, MD      . alum & mag hydroxide-simeth (MAALOX/MYLANTA) 200-200-20 MG/5ML suspension 30 mL  30 mL Oral Q6H PRN Theressa Millard, MD      . calcium gluconate 1 g in sodium chloride 0.9 % 100 mL IVPB  1 g Intravenous Once Hosie Poisson, MD   1 g at 12/18/13 1022  . cholecalciferol (VITAMIN D) tablet 1,000 Units  1,000 Units Oral Daily Theressa Millard, MD   1,000 Units at 12/18/13 1021  . doxazosin (CARDURA) tablet 2 mg  2 mg Oral QHS Theressa Millard, MD   2 mg at 12/17/13 2117  . enoxaparin (LOVENOX) injection 30 mg  30 mg Subcutaneous Q24H Theressa Millard, MD  30 mg at 12/17/13 2118  . furosemide (LASIX) tablet 20 mg  20 mg Oral Daily Theressa Millard, MD   20 mg at 12/18/13 1021  . HYDROmorphone (DILAUDID) injection 0.5-1 mg  0.5-1 mg Intravenous Q3H PRN Theressa Millard, MD      . insulin aspart (novoLOG) injection 0-5 Units  0-5 Units Subcutaneous QHS Harvette C Jenkins, MD      . insulin aspart (novoLOG) injection 0-9 Units  0-9 Units Subcutaneous TID WC Theressa Millard, MD   2 Units at 12/18/13 [phone]  . insulin glargine (LANTUS) injection 5 Units  5 Units Subcutaneous QHS Hosie Poisson, MD   5 Units at 12/17/13 2119  . mirtazapine (REMERON) tablet 15 mg  15 mg Oral QHS Theressa Millard, MD   15 mg at 12/17/13 2118  . ondansetron (ZOFRAN) tablet 4 mg  4 mg Oral Q6H PRN Theressa Millard, MD       Or  . ondansetron (ZOFRAN) injection 4 mg   4 mg Intravenous Q6H PRN Theressa Millard, MD      . oxyCODONE (Oxy IR/ROXICODONE) immediate release tablet 5 mg  5 mg Oral Q4H PRN Theressa Millard, MD      . sodium bicarbonate tablet 325 mg  325 mg Oral TID Theressa Millard, MD   325 mg at 12/18/13 1021  . sodium chloride 0.9 % injection 3 mL  3 mL Intravenous Q12H Theressa Millard, MD   3 mL at 12/17/13 2119  . sodium chloride 0.9 % injection 3 mL  3 mL Intravenous Q12H Theressa Millard, MD   3 mL at 12/18/13 1022  . sodium chloride 0.9 % injection 3 mL  3 mL Intravenous PRN Theressa Millard, MD        Allergies as of 12/16/2013  . (No Known Allergies)    History reviewed. No pertinent family history.  History   Social History  . Marital Status: Widowed    Spouse Name: N/A    Number of Children: N/A  . Years of Education: N/A   Occupational History  . Not on file.   Social History Main Topics  . Smoking status: Former Smoker    Types: Cigarettes, Pipe, Cigars    Quit date: 06/23/1978  . Smokeless tobacco: Former Systems developer    Types: Chew    Quit date: 12/16/1949  . Alcohol Use: No  . Drug Use: No  . Sexual Activity: No   Other Topics Concern  . Not on file   Social History Narrative  . No narrative on file    Review of Systems: Gen: Denies any fever, chills, sweats, + weakness HEENT: No visual complaints, No history of Retinopathy. Normal external appearance No Epistaxis or Sore throat. No sinusitis.   CV: Denies chest pain, angina, palpitations, syncope, orthopnea, PND, peripheral edema, and claudication. Resp: Denies dyspnea at rest, dyspnea with exercise, cough, sputum, wheezing, coughing up blood, and pleurisy. GI: Denies vomiting blood, jaundice, and fecal incontinence.   Denies dysphagia or odynophagia. GU : Denies urinary burning, blood in urine, urinary frequency, urinary hesitancy, nocturnal urination, and urinary incontinence.  No renal calculi. MS: Denies joint pain, limitation of movement, and  swelling, stiffness, low back pain, extremity pain. Denies muscle weakness, cramps, atrophy.  No use of non steroidal antiinflammatory drugs. Derm: Denies rash, itching, dry skin, hives, moles, warts, or unhealing ulcers.  Psych: Denies depression, anxiety, memory loss, suicidal ideation, hallucinations, paranoia, and confusion. Heme: Denies bruising, bleeding,  and enlarged lymph nodes. Neuro: No headache.  No diplopia. No dysarthria.  No dysphasia.  No history of CVA.  No Seizures. No paresthesias.  No weakness. Endocrine + DM.  No Thyroid disease.  No Adrenal disease.  Physical Exam: Vital signs in last 24 hours: Temp:  [97.8 F (36.6 C)-98.3 F (36.8 C)] 98.3 F (36.8 C) (06/28 0822) Pulse Rate:  [58-71] 71 (06/28 0822) Resp:  [16-20] 20 (06/28 0822) BP: (153-169)/(64-84) 153/64 mmHg (06/28 0822) SpO2:  [95 %-98 %] 97 % (06/28 0822) Last BM Date: 12/17/13 General:   Elderly  Head:  Normocephalic and atraumatic. Eyes:  Sclera clear, no icterus.   Conjunctiva pale. Ears:  Normal auditory acuity. Nose:  No deformity, discharge,  or lesions. Mouth:  No deformity or lesions, dentition normal. Neck:  Supple; no masses or thyromegaly. JVP not elevated Lungs:  Clear throughout to auscultation.   No wheezes, crackles, or rhonchi. No acute distress. Heart:  Regular rate and rhythm;  Faint 2/6 murmur. Abdomen:  Soft, nontender and nondistended. No masses, hepatosplenomegaly or hernias noted. Normal bowel sounds, without guarding, and without rebound.   Msk:  Symmetrical without gross deformities. Normal posture. Pulses:  No carotid, renal, femoral bruits. DP and PT symmetrical and equal Extremities:  Without clubbing or edema. Neurologic:  Alert and  oriented x4;  grossly normal neurologically. Skin:  Intact without significant lesions or rashes. Cervical Nodes:  No significant cervical adenopathy. Psych:  Alert and cooperative. Normal mood and affect.  Intake/Output from previous  day: 06/27 0701 - 06/28 0700 In: 603 [P.O.:600; I.V.:3] Out: -  Intake/Output this shift: Total I/O In: 240 [P.O.:240] Out: -   Lab Results:  Recent Labs  12/16/13 1807 12/17/13 0325  WBC 4.7 4.4  HGB 8.7* 8.1*  HCT 24.9* 23.8*  PLT 172 137*   BMET  Recent Labs  12/16/13 1807 12/17/13 0325 12/18/13 0523  NA 135* 136* 141  K 5.7* 5.4*  5.2 4.8  CL 103 104 108  CO2 16* 17* 18*  GLUCOSE 317* 173* 163*  BUN 65* 62* 58*  CREATININE 4.78* 4.86* 4.98*  CALCIUM 7.2* 7.5* 7.2*  PHOS  --  5.4*  --    LFT  Recent Labs  12/16/13 1807 12/17/13 0325  PROT 6.5 5.8*  ALBUMIN 3.7 3.3*  AST 23 20  ALT 22 21  ALKPHOS 141* 123*  BILITOT <0.2* <0.2*  BILIDIR <0.2  --   IBILI NOT CALCULATED  --    PT/INR No results found for this basename: LABPROT, INR,  in the last 72 hours Hepatitis Panel No results found for this basename: HEPBSAG, HCVAB, HEPAIGM, HEPBIGM,  in the last 72 hours  Studies/Results: No results found.  Assessment/Plan:  CKD stage 5  Patient has two children a daughter in [place] and son in [place]. He has discussed dialysis with them and has decided not to proceed. He will continue medical therapy and agree with the current plans  HTN controlled  Bones hypocalcemia will check PTH may benefit from rocaltrol   Anemia check iron stores start ESA     LOS: 2 WEBB,MARTIN W @TODAY @11 :13 AM

## 2013-12-18 NOTE — ED Provider Notes (Signed)
  Medical screening examination/treatment/procedure(s) were performed by non-physician practitioner and as supervising physician I was immediately available for consultation/collaboration.   Dot Lanes, MD 12/18/13 [phone]

## 2013-12-18 NOTE — Progress Notes (Signed)
[facility] PROGRESS NOTE  Mario Turner OZD:[id number] DOB: 1928-05-28 DOA: 12/16/2013 PCP: Lilian Coma, MD  Asses1. Electrolyte Imbalance- Hyperkalemia, and Hypocalcemia , due to Renal Impairment.  2. Hyperkalemia- Due to renal disease. Kayexalate 30 gram PO x 2 . Re-checked and normal value on repeat .  3. Hypocalcemia- Replete Calcium with IV Calcium Gluconate, Re-Check Calcium level in AM. Albumin level was WNL at 3.7.  4. DM2- With Renal complication and Hyperglycemia- Not on medications currently, SSI coverage ordered hgba1c is 8.2.will start him on low dose lantus and SSI.   CBG (last 3)   Recent Labs  12/18/13 0726 12/18/13 1138 12/18/13 1658  GLUCAP 154* 173* 157*     5. Anemia of Renal Disease- anemia panel reveal iron deficiency .  6. HTN- Continue Lasix , Metoprolol, and Doxazosin Rx, Monitor BPs.  7. CKD Stage V- on Bicarb 325 mg TID, Check phosphorus level.  8. Metabolic Acidosis due to #7. On bicarb TID , may need more replacement.  9. DVT Prophylaxis with Lovenox.  Code Status: FULL CODE  Family Communication: No Family present , spoke to son over the phone and in person. .  Disposition Plan: Inpatient   Consultants: Renal.  Procedures:  none  Antibiotics:  none  HPI/Subjective: No new complaints. Wants to go home.  Discussed about renal consult and possibly medical management.   Objective: Filed Vitals:   12/18/13 1428  BP: 168/57  Pulse: 54  Temp: 97.5 F (36.4 C)  Resp: 18    Intake/Output Summary (Last 24 hours) at 12/18/13 1745 Last data filed at 12/18/13 1250  Gross per 24 hour  Intake    843 ml  Output      0 ml  Net    843 ml   Filed Weights   12/16/13 2155  Weight: 73.573 kg (162 lb 3.2 oz)    Exam:   General:  Alert afebrile comfortable  Cardiovascular: s1s2  Respiratory: ctab  Abdomen: soft NT ND BS+  Musculoskeletal: NO PEDAL EDEMA.   Data Reviewed: Basic Metabolic Panel:  Recent Labs Lab  12/16/13 1807 12/17/13 0325 12/18/13 0523  NA 135* 136* 141  K 5.7* 5.4*  5.2 4.8  CL 103 104 108  CO2 16* 17* 18*  GLUCOSE 317* 173* 163*  BUN 65* 62* 58*  CREATININE 4.78* 4.86* 4.98*  CALCIUM 7.2* 7.5* 7.2*  MG 1.9  --   --   PHOS  --  5.4*  --    Liver Function Tests:  Recent Labs Lab 12/16/13 1807 12/17/13 0325  AST 23 20  ALT 22 21  ALKPHOS 141* 123*  BILITOT <0.2* <0.2*  PROT 6.5 5.8*  ALBUMIN 3.7 3.3*   No results found for this basename: LIPASE, AMYLASE,  in the last 168 hours No results found for this basename: AMMONIA,  in the last 168 hours CBC:  Recent Labs Lab 12/16/13 1807 12/17/13 0325  WBC 4.7 4.4  HGB 8.7* 8.1*  HCT 24.9* 23.8*  MCV 89.6 90.8  PLT 172 137*   Cardiac Enzymes: No results found for this basename: CKTOTAL, CKMB, CKMBINDEX, TROPONINI,  in the last 168 hours BNP (last 3 results) No results found for this basename: PROBNP,  in the last 8760 hours CBG:  Recent Labs Lab 12/17/13 1659 12/17/13 2122 12/18/13 0726 12/18/13 1138 12/18/13 1658  GLUCAP 192* 167* 154* 173* 157*    No results found for this or any previous visit (from the past 240 hour(s)).   Studies: No results  found.  Scheduled Meds: . cholecalciferol  1,000 Units Oral Daily  . doxazosin  2 mg Oral QHS  . enoxaparin (LOVENOX) injection  30 mg Subcutaneous Q24H  . furosemide  20 mg Oral Daily  . insulin aspart  0-5 Units Subcutaneous QHS  . insulin aspart  0-9 Units Subcutaneous TID WC  . insulin glargine  5 Units Subcutaneous QHS  . mirtazapine  15 mg Oral QHS  . sodium bicarbonate  325 mg Oral TID  . sodium chloride  3 mL Intravenous Q12H  . sodium chloride  3 mL Intravenous Q12H   Continuous Infusions:   Principal Problem:   Electrolyte imbalance Active Problems:   Diabetes mellitus, type II   Chronic kidney disease, stage V   Hyperkalemia   Hypocalcemia   Anemia of renal disease   Benign essential HTN   Metabolic acidosis    Time spent:  35 minutes    [facility] Pager [phone]. If 7PM-7AM, please contact night-coverage at [email or web address], password [email or web address] 12/18/2013, 5:45 PM  LOS: 2 days

## 2013-12-18 NOTE — Evaluation (Signed)
Physical Therapy Evaluation Patient Details Name: Mario Turner MRN: [id number] DOB: 1927-11-19 Today's Date: 12/18/2013   History of Present Illness  80 y.o. male with a history of Stage V CKD and ptt has decided not to undergo dialysis treatment who was admitted 6/26 for electrolyte Imbalance- hyperkalemia, and hypocalcemia, due to renal impairment.    Clinical Impression  Pt admitted with above. Pt currently with functional limitations due to the deficits listed below (see PT Problem List).  Pt will benefit from skilled PT to increase their independence and safety with mobility to allow discharge to the venue listed below.  Pt reports generalized weakness and overall not feeling well however will likely progress well and not require f/u PT.      Follow Up Recommendations No PT follow up    Equipment Recommendations  None recommended by PT    Recommendations for Other Services       Precautions / Restrictions Restrictions Weight Bearing Restrictions: No      Mobility  Bed Mobility Overal bed mobility: Modified Independent                Transfers Overall transfer level: Needs assistance Equipment used: None Transfers: Sit to/from Stand Sit to Stand: Min guard            Ambulation/Gait Ambulation/Gait assistance: Min guard Ambulation Distance (Feet): 200 Feet Assistive device: None Gait Pattern/deviations: Step-through pattern;Decreased stride length Gait velocity: decr   General Gait Details: pt reports generalized weakness and fatigue, no unsteadiness or LOB observed  Stairs            Wheelchair Mobility    Modified Rankin (Stroke Patients Only)       Balance                                             Pertinent Vitals/Pain HR at rest 57 bpm HR during ambulation 87 bpm HR upon sitting in recliner 69 bpm    Home Living Family/patient expects to be discharged to:: Private residence Living Arrangements:  Non-relatives/Friends   Type of Home: House Home Access: Stairs to enter   Technical brewer of Steps: 1 Home Layout: One level Home Equipment: None      Prior Function Level of Independence: Independent         Comments: 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 [employer]        Extremity/Trunk Assessment               Lower Extremity Assessment: Generalized weakness         Communication   Communication: HOH  Cognition Arousal/Alertness: Awake/alert Behavior During Therapy: WFL for tasks assessed/performed Overall Cognitive Status: Within Functional Limits for tasks assessed                      General Comments      Exercises        Assessment/Plan    PT Assessment Patient needs continued PT services  PT Diagnosis Difficulty walking;Generalized weakness   PT Problem List Decreased strength;Decreased mobility;Decreased activity tolerance  PT Treatment Interventions DME instruction;Gait training;Functional mobility training;Therapeutic activities;Therapeutic exercise;Patient/family education   PT Goals (Current goals can be found in the Care Plan section) Acute Rehab PT Goals PT Goal Formulation: With patient Time For Goal Achievement: 12/25/13 Potential to Achieve Goals: Good  Frequency Min 3X/week   Barriers to discharge        Co-evaluation               End of Session   Activity Tolerance: Patient limited by fatigue Patient left: with call bell/phone within reach;in chair (with MD) Nurse Communication: Mobility status         Time: 8032-1224 PT Time Calculation (min): 9 min   Charges:   PT Evaluation $Initial PT Evaluation Tier I: 1 Procedure PT Treatments $Gait Training: 8-22 mins   PT G Codes:          LEMYRE,KATHrine E 12/18/2013, 11:08 AM Carmelia Bake, PT, DPT 12/18/2013 Pager: [phone]

## 2013-12-19 DIAGNOSIS — E875 Hyperkalemia: Secondary | ICD-10-CM

## 2013-12-19 DIAGNOSIS — N189 Chronic kidney disease, unspecified: Secondary | ICD-10-CM

## 2013-12-19 DIAGNOSIS — N039 Chronic nephritic syndrome with unspecified morphologic changes: Secondary | ICD-10-CM

## 2013-12-19 DIAGNOSIS — D631 Anemia in chronic kidney disease: Secondary | ICD-10-CM

## 2013-12-19 DIAGNOSIS — N179 Acute kidney failure, unspecified: Secondary | ICD-10-CM

## 2013-12-19 LAB — CBC
HCT: 27.3 % — ABNORMAL LOW (ref 39.0–52.0)
HEMOGLOBIN: 9.5 g/dL — AB (ref 13.0–17.0)
MCH: 30.7 pg (ref 26.0–34.0)
MCHC: 34.8 g/dL (ref 30.0–36.0)
MCV: 88.3 fL (ref 78.0–100.0)
Platelets: 187 10*3/uL (ref 150–400)
RBC: 3.09 MIL/uL — AB (ref 4.22–5.81)
RDW: 13.1 % (ref 11.5–15.5)
WBC: 4.6 10*3/uL (ref 4.0–10.5)

## 2013-12-19 LAB — IRON AND TIBC
Iron: 40 ug/dL — ABNORMAL LOW (ref 42–135)
Saturation Ratios: 16 % — ABNORMAL LOW (ref 20–55)
TIBC: 246 ug/dL (ref 215–435)
UIBC: 206 ug/dL (ref 125–400)

## 2013-12-19 LAB — RETICULOCYTES
RBC.: 3.06 MIL/uL — AB (ref 4.22–5.81)
RETIC CT PCT: 0.9 % (ref 0.4–3.1)
Retic Count, Absolute: 27.5 10*3/uL (ref 19.0–186.0)

## 2013-12-19 LAB — FOLATE

## 2013-12-19 LAB — BASIC METABOLIC PANEL
BUN: 57 mg/dL — AB (ref 6–23)
CALCIUM: 7.5 mg/dL — AB (ref 8.4–10.5)
CHLORIDE: 107 meq/L (ref 96–112)
CO2: 19 mEq/L (ref 19–32)
Creatinine, Ser: 4.53 mg/dL — ABNORMAL HIGH (ref 0.50–1.35)
GFR calc Af Amer: 13 mL/min — ABNORMAL LOW (ref >90)
GFR, EST NON AFRICAN AMERICAN: 11 mL/min — AB (ref >90)
Glucose, Bld: 151 mg/dL — ABNORMAL HIGH (ref 70–99)
Potassium: 4.9 mEq/L (ref 3.7–5.3)
Sodium: 139 mEq/L (ref 137–147)

## 2013-12-19 LAB — GLUCOSE, CAPILLARY
GLUCOSE-CAPILLARY: 139 mg/dL — AB (ref 70–99)
Glucose-Capillary: 201 mg/dL — ABNORMAL HIGH (ref 70–99)

## 2013-12-19 LAB — VITAMIN B12: VITAMIN B 12: 452 pg/mL (ref 211–911)

## 2013-12-19 LAB — FERRITIN: Ferritin: 85 ng/mL (ref 22–322)

## 2013-12-19 MED ORDER — INSULIN GLARGINE 100 UNIT/ML ~~LOC~~ SOLN: 5.0000 [IU] | Freq: Every day | SUBCUTANEOUS | Status: AC

## 2013-12-19 MED ORDER — METOPROLOL TARTRATE 50 MG PO TABS: 25.0000 mg | ORAL_TABLET | Freq: Two times a day (BID) | ORAL | Status: AC

## 2013-12-19 MED ORDER — CALCIUM CARBONATE-VITAMIN D 500-200 MG-UNIT PO TABS: 1.0000 | ORAL_TABLET | Freq: Two times a day (BID) | ORAL | Status: AC

## 2013-12-19 MED ORDER — DARBEPOETIN ALFA-POLYSORBATE 40 MCG/0.4ML IJ SOLN
40.0000 ug | Freq: Once | INTRAMUSCULAR | Status: AC
  Administered 2013-12-19: 40 ug via INTRAVENOUS
  Filled 2013-12-19 (×2): qty 0.4

## 2013-12-19 NOTE — Progress Notes (Signed)
PT Cancellation Note  Patient Details Name: Mario Turner MRN: [id number] DOB: 12-22-1927   Cancelled Treatment:    Reason Eval/Treat Not Completed: pt politely declined-getting ready to get dressed in preparation for d/c per pt. Observed pt ambulating from bathroom without assistance/device.    Weston Anna, MPT Pager: [phone]

## 2013-12-19 NOTE — Discharge Summary (Signed)
Physician Discharge Summary  Alcide Memoli BTD:[id number] DOB: May 26, 1928 DOA: 12/16/2013  PCP: Lilian Coma, MD  Admit date: 12/16/2013 Discharge date: 12/19/2013  Time spent: 30 minutes  Recommendations for Outpatient Follow-up:  1. Follow up with nephrologist as recommended 2. Follow up with PCP in one week 3. Check renal parameters and calcium in 2 weeks.   Discharge Diagnoses:  Principal Problem:   Electrolyte imbalance Active Problems:   Diabetes mellitus, type II   Chronic kidney disease, stage V   Hyperkalemia   Hypocalcemia   Anemia of renal disease   Benign essential HTN   Metabolic acidosis   Discharge Condition: improved  Diet recommendation: renal diet  Filed Weights   12/16/13 2155  Weight: 73.573 kg (162 lb 3.2 oz)    History of present illness:  Mario Turner is a 80 y.o. male with a history of Stage V CKD not on diialysis rx who was sent to the ED after bloodwork returned with abnormal values. His calcium level was low and in the ED he was also found to have an elevated potassium level. His BUN/Cr was 65/4.78. He is decided many years ago that he would not undergo dialysis treatment and he remains true to his decision today. He was referred for admission for his electrolyte disorder.    Hospital Course:   1.  Electrolyte Imbalance- Hyperkalemia, and Hypocalcemia , due to Renal Impairment. Resolved on discharge. 2. Hyperkalemia- Due to renal disease. Kayexalate 30 gram PO x 2 . Re-checked and normal value on repeat .  3. Hypocalcemia- Replete Calcium with IV Calcium Gluconate, Re-Check Calcium level in AM slight improvement.. Albumin level was WNL at 3.7.  4. DM2- With Renal complication and Hyperglycemia- Not on medications currently, SSI coverage ordered hgba1c is 8.2.will start him on low dose lantus and SSI.  CBG (last 3)   Recent Labs   12/18/13 0726  12/18/13 1138  12/18/13 1658   GLUCAP  154*  173*  157*    5. Anemia of Renal Disease-  anemia panel reveal iron deficiency . Iron supplementation provided.  6. HTN- Continue Lasix , Metoprolol, and Doxazosin Rx, Monitor BPs as outpatient  7. CKD Stage V- on Bicarb 325 mg TID, follow up with NEPHROLOGY as outpatient 8. Metabolic Acidosis due to #7. On bicarb TID , improved.   Procedures:  none  Consultations:  renal  Discharge Exam: Filed Vitals:   12/19/13 1446  BP: 143/73  Pulse: 84  Temp: 97.5 F (36.4 C)  Resp: 20    General: alert comfortable Cardiovascular: s1s2 Respiratory: ctab  Discharge Instructions You were cared for by a hospitalist during your hospital stay. If you have any questions about your discharge medications or the care you received while you were in the hospital after you are discharged, you can call the unit and asked to speak with the hospitalist on call if the hospitalist that took care of you is not available. Once you are discharged, your primary care physician will handle any further medical issues. Please note that NO REFILLS for any discharge medications will be authorized once you are discharged, as it is imperative that you return to your primary care physician (or establish a relationship with a primary care physician if you do not have one) for your aftercare needs so that they can reassess your need for medications and monitor your lab values.  Discharge Instructions   Discharge instructions    Complete by:  As directed   Follow up with PCP in  one week.  Follow up with [facility] as recommended. In one week.            Medication List         calcium-vitamin D 500-200 MG-UNIT per tablet  Commonly known as:  OSCAL WITH D  Take 1 tablet by mouth 2 (two) times daily.     cholecalciferol 1000 UNITS tablet  Commonly known as:  VITAMIN D  Take 1,000 Units by mouth daily.     doxazosin 2 MG tablet  Commonly known as:  CARDURA  Take 2 mg by mouth at bedtime.     furosemide 20 MG tablet  Commonly known as:   LASIX  Take 20 mg by mouth daily.     insulin glargine 100 UNIT/ML injection  Commonly known as:  LANTUS  Inject 0.05 mLs (5 Units total) into the skin at bedtime.     metoprolol 50 MG tablet  Commonly known as:  LOPRESSOR  Take 0.5 tablets (25 mg total) by mouth 2 (two) times daily.     mirtazapine 15 MG tablet  Commonly known as:  REMERON  Take 15 mg by mouth at bedtime.     sodium bicarbonate 325 MG tablet  Take 325 mg by mouth 3 (three) times daily.       No Known Allergies     Follow-up Information   Follow up with Lilian Coma, MD. Schedule an appointment as soon as possible for a visit in 1 week.   Specialty:  Family Medicine   Contact information:   [address] [address] [phone]       Follow up with [facility]. Schedule an appointment as soon as possible for a visit in 1 week.   Contact information:   [place] [address] [phone]        The results of significant diagnostics from this hospitalization (including imaging, microbiology, ancillary and laboratory) are listed below for reference.    Significant Diagnostic Studies: No results found.  Microbiology: No results found for this or any previous visit (from the past 240 hour(s)).   Labs: Basic Metabolic Panel:  Recent Labs Lab 12/16/13 1807 12/17/13 0325 12/18/13 0523 12/19/13 0442  NA 135* 136* 141 139  K 5.7* 5.4*  5.2 4.8 4.9  CL 103 104 108 107  CO2 16* 17* 18* 19  GLUCOSE 317* 173* 163* 151*  BUN 65* 62* 58* 57*  CREATININE 4.78* 4.86* 4.98* 4.53*  CALCIUM 7.2* 7.5* 7.2* 7.5*  MG 1.9  --   --   --   PHOS  --  5.4*  --   --    Liver Function Tests:  Recent Labs Lab 12/16/13 1807 12/17/13 0325  AST 23 20  ALT 22 21  ALKPHOS 141* 123*  BILITOT <0.2* <0.2*  PROT 6.5 5.8*  ALBUMIN 3.7 3.3*   No results found for this basename: LIPASE, AMYLASE,  in the last 168 hours No results found for this basename: AMMONIA,   in the last 168 hours CBC:  Recent Labs Lab 12/16/13 1807 12/17/13 0325 12/19/13 1300  WBC 4.7 4.4 4.6  HGB 8.7* 8.1* 9.5*  HCT 24.9* 23.8* 27.3*  MCV 89.6 90.8 88.3  PLT 172 137* 187   Cardiac Enzymes: No results found for this basename: CKTOTAL, CKMB, CKMBINDEX, TROPONINI,  in the last 168 hours BNP: BNP (last 3 results) No results found for this basename: PROBNP,  in the last 8760 hours CBG:  Recent Labs Lab 12/18/13 1138 12/18/13 1658 12/18/13 2125 12/19/13 0756 12/19/13 1151  GLUCAP 173* 157* 132* 139* 201*       Signed:  AKULA,VIJAYA  [facility] 12/19/2013, 3:38 PM

## 2013-12-19 NOTE — Progress Notes (Signed)
MEDICATION RELATED CONSULT NOTE - INITIAL   Pharmacy Consult for Aranesp Indication: Anemia of Chronic Kidney Disease  No Known Allergies  Patient Measurements: Height: 5\' 7"  (170.2 cm) Weight: 162 lb 3.2 oz (73.573 kg) IBW/kg (Calculated) : 66.1  Vital Signs: Temp: 97.9 F (36.6 C) (06/29 0451) Temp src: Oral (06/29 0451) BP: 187/72 mmHg (06/29 0451) Pulse Rate: 58 (06/29 0451) Intake/Output from previous day: 06/28 0701 - 06/29 0700 In: 1443 [P.O.:1440; I.V.:3] Out: 300 [Urine:300] Intake/Output from this shift:    Labs:  Recent Labs  12/16/13 1807 12/17/13 0325 12/18/13 0523 12/19/13 0442  WBC 4.7 4.4  --   --   HGB 8.7* 8.1*  --   --   HCT 24.9* 23.8*  --   --   PLT 172 137*  --   --   CREATININE 4.78* 4.86* 4.98* 4.53*  MG 1.9  --   --   --   PHOS  --  5.4*  --   --   ALBUMIN 3.7 3.3*  --   --   PROT 6.5 5.8*  --   --   AST 23 20  --   --   ALT 22 21  --   --   ALKPHOS 141* 123*  --   --   BILITOT <0.2* <0.2*  --   --   BILIDIR <0.2  --   --   --   IBILI NOT CALCULATED  --   --   --    Estimated Creatinine Clearance: 11.6 ml/min (by C-G formula based on Cr of 4.53).   Microbiology: No results found for this or any previous visit (from the past 720 hour(s)).  Medical History: Past Medical History  Diagnosis Date  . Diabetes mellitus without complication   . Chronic kidney disease   . Chronic kidney disease, stage V 03/29/2012  . Anemia of renal disease 12/17/2013  . Benign essential HTN 12/17/2013    Medications:  Scheduled:  . cholecalciferol  1,000 Units Oral Daily  . doxazosin  2 mg Oral QHS  . enoxaparin (LOVENOX) injection  30 mg Subcutaneous Q24H  . furosemide  20 mg Oral Daily  . insulin aspart  0-5 Units Subcutaneous QHS  . insulin aspart  0-9 Units Subcutaneous TID WC  . insulin glargine  5 Units Subcutaneous QHS  . mirtazapine  15 mg Oral QHS  . sodium bicarbonate  325 mg Oral TID  . sodium chloride  3 mL Intravenous Q12H  .  sodium chloride  3 mL Intravenous Q12H   Infusions:   PRN: sodium chloride, acetaminophen, acetaminophen, alum & mag hydroxide-simeth, HYDROmorphone (DILAUDID) injection, ondansetron (ZOFRAN) IV, ondansetron, oxyCODONE, sodium chloride  Assessment: 51 yom with history of Stage V CKD not on dialysis with Hbg of 8.1 on 6/27.  -Amemia panel reveals iron deficiency  Plan:  Start Aranesp 23mcg subcutaneous x1 today at 1800 Follow H/H   Kizzie Furnish, PharmD Pager: [phone] 12/19/2013 11:49 AM

## 2013-12-19 NOTE — Plan of Care (Signed)
Problem: Food- and Nutrition-Related Knowledge Deficit (NB-1.1) Goal: Nutrition education Formal process to instruct or train a patient/client in a skill or to impart knowledge to help patients/clients voluntarily manage or modify food choices and eating behavior to maintain or improve health.  Outcome: Completed/Met Date Met:  12/19/13 Nutrition Education Note  RD consulted for Renal Education. Provided High Potassium/Low Potassium food lists and Phosphorus food lists to patient/family. Reviewed food groups and provided written recommended serving sizes specifically determined for patient's current nutritional status.   Explained why diet restrictions are needed and provided lists of foods to limit/avoid that are high potassium, sodium, and phosphorus. Provided specific recommendations on safer alternatives of these foods. Strongly encouraged compliance of this diet.   Teach back method used.  Expect fair compliance. Pt's family member were present and were in agreement to assist pt in compliance with diet. Has had previous episodes of high potassium, but had no diet education of which foods to avoid. Reviewed lower potassium food alternatives. Pt also with elevated phosphorus, and discussed high sources to avoid. Provided daily recommendations for each nutrients. Pt noted that he lives with someone who has to comply with similar diet, and family member was taking notes during education; both indicative of a good support system upon d/c  Body mass index is 25.4 kg/(m^2). Pt meets criteria for Normal based on current BMI.  Current diet order is Renal/Carb Mod, patient is consuming approximately >75% of meals at this time. Labs and medications reviewed. No further nutrition interventions warranted at this time. RD contact information provided. If additional nutrition issues arise, please re-consult RD.  Mario Abide MS RD LDN Clinical Dietitian [phone]

## 2013-12-20 LAB — PARATHYROID HORMONE, INTACT (NO CA): PTH: 278.8 pg/mL — AB (ref 14.0–72.0)

## 2014-03-10 IMAGING — US US ABDOMEN COMPLETE
1 series · 13 of 25 positions shown · non-contrast
Comparison: None.

CLINICAL DATA: History of bladder cancer, evaluate for abdominal
mass

COMPLETE ABDOMINAL ULTRASOUND

[Series 1: us abdomen complete · 0.27mm/px · 13 of 50 slices shown]
[im 1/50]
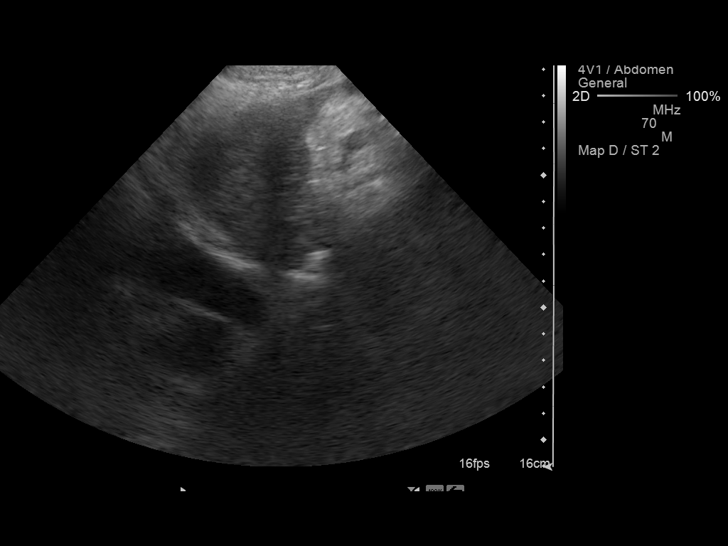
[im 5/50]
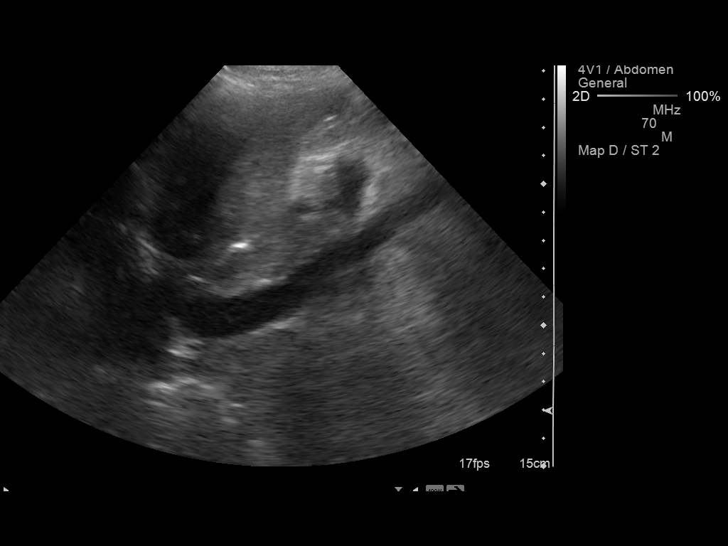
[im 9/50]
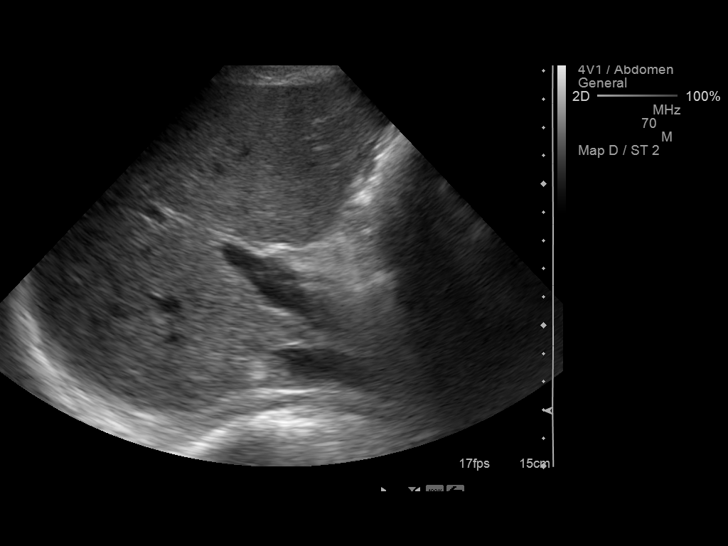
[im 13/50]
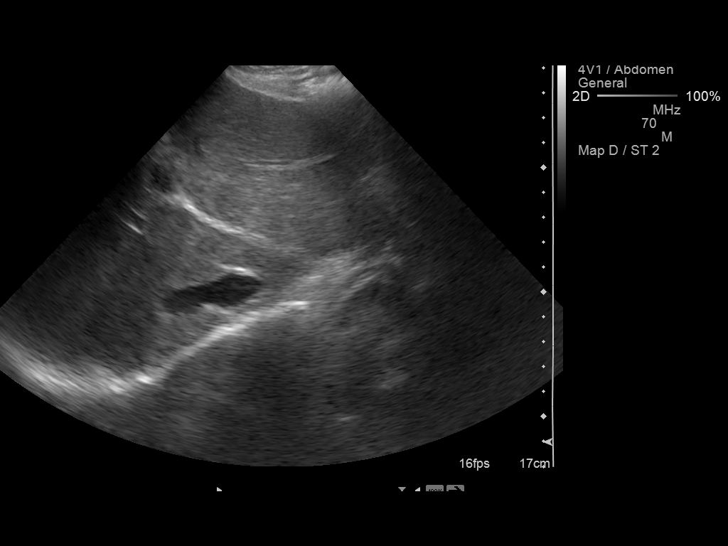
[im 17/50]
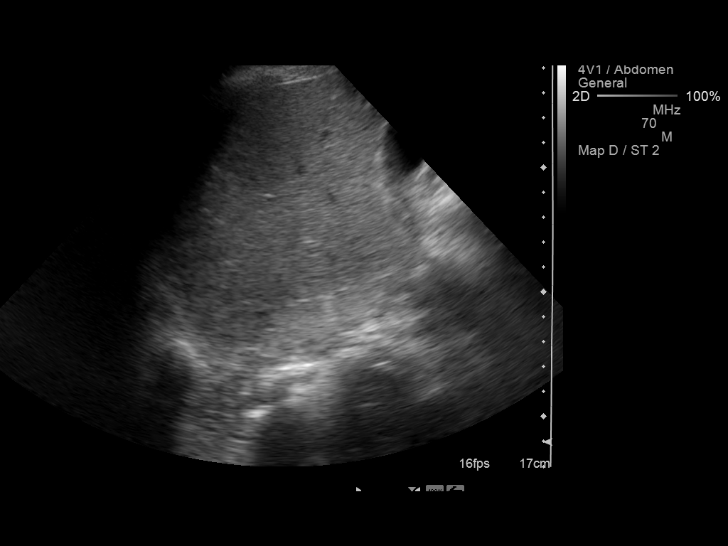
[im 21/50]
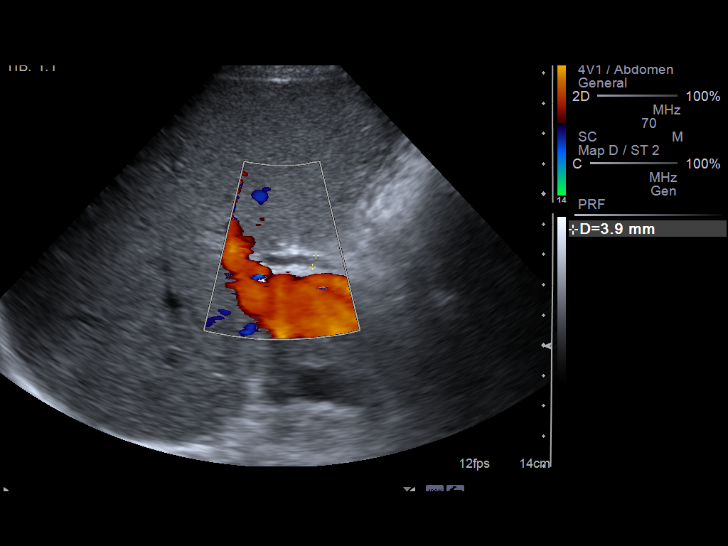
[im 25/50]
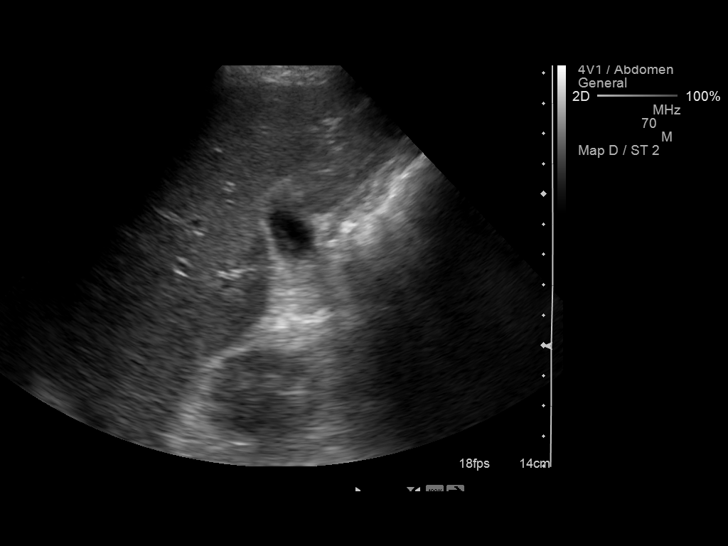
[im 29/50]
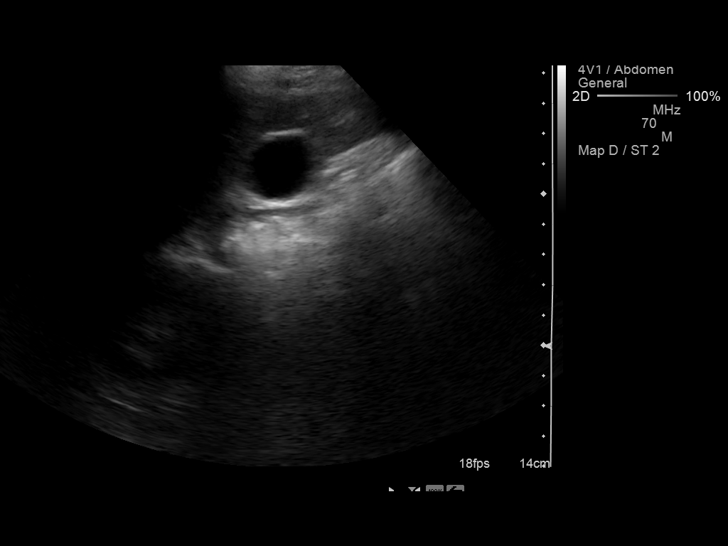
[im 33/50]
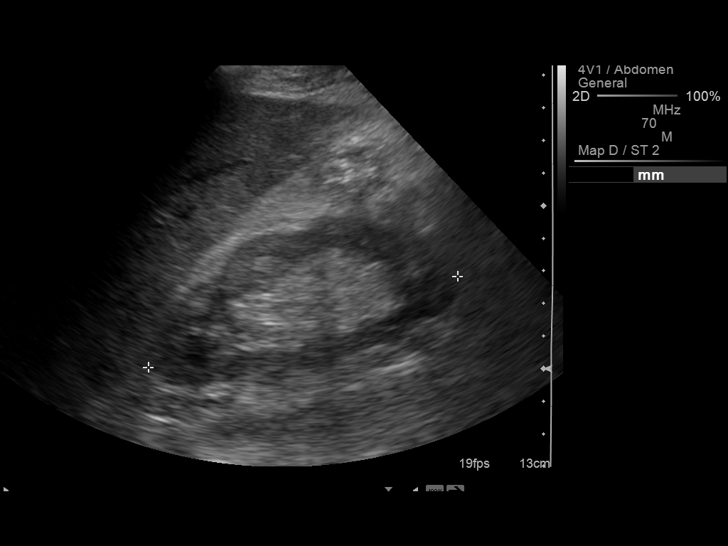
[im 37/50]
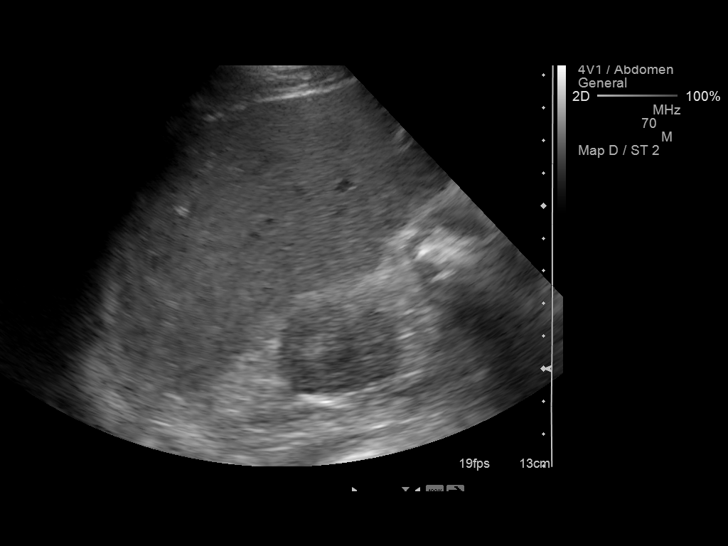
[im 41/50]
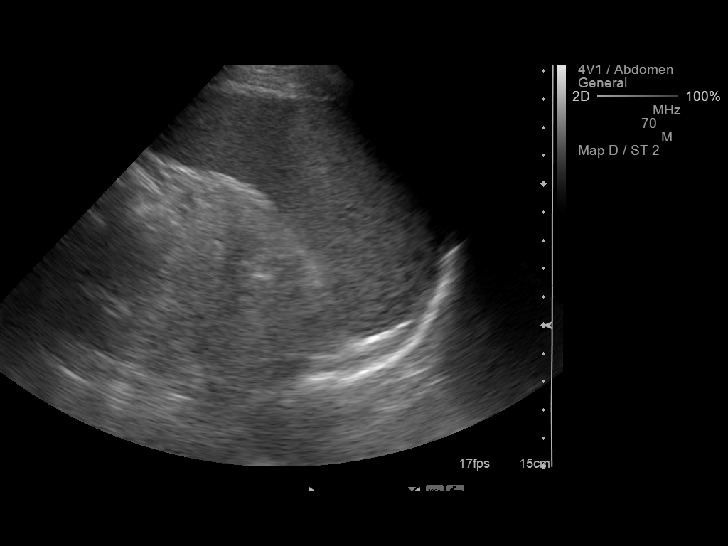
[im 45/50]
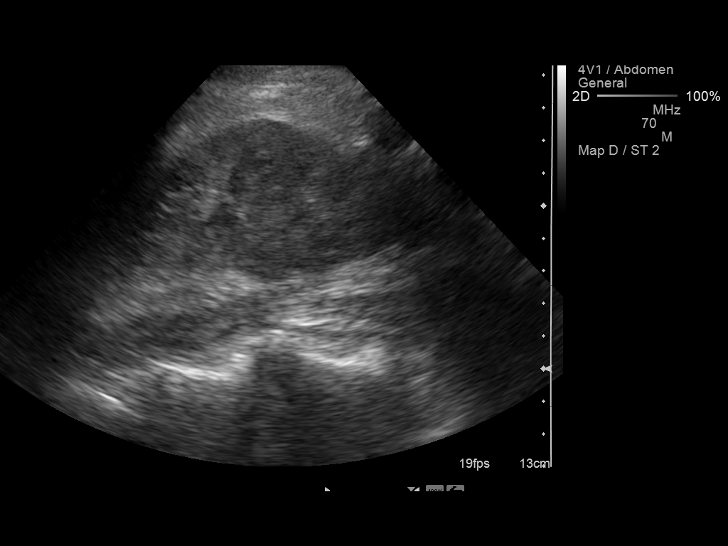
[im 50/50]
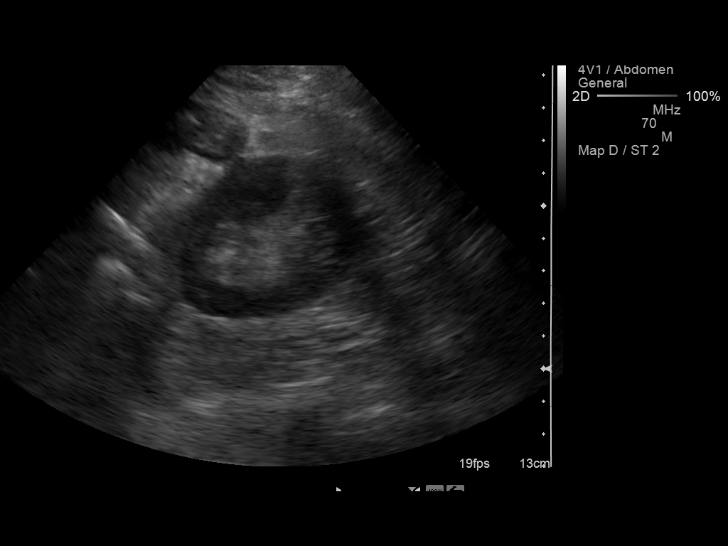

[13 of 25 positions shown; findings below may reference images not displayed]

FINDINGS: Gallbladder:  There is a minimal amount of layering echogenic
debris/sludge within an otherwise normal-appearing gallbladder.  No
gallbladder wall thickening or pericholecystic fluid.  Negative
sonographic Murphy's sign.

Common bile duct:  Normal in size measuring 3.9 mm in diameter.

Liver:  Homogeneous hepatic echotexture.  No discrete hepatic
lesions.  No definite evidence of intrahepatic biliary ductal
dilatation.  No ascites.

IVC:  Appears normal.

Pancreas:  Limited visualization of the pancreatic head and neck is
normal.  Visualization of the pancreatic body and tail is obscured
by bowel gas.

Spleen:  Normal in size measuring 13 cm in length.

Right Kidney:  Normal cortical thickness, echogenicity and size,
measuring 10.5 cm in length.  No focal renal lesions.  No echogenic
renal stones.  No urinary obstruction.

Left Kidney:  Normal cortical thickness, echogenicity and size,
measuring 10.9 cm in length.  Note is made of an approximately
cm anechoic lesion within the superior pole of the left kidney
(image 48) which is too small to accurately characterize but
favored to represent a renal cyst.   No echogenic renal stones.  No
urinary obstruction.

Abdominal aorta:  No aneurysm identified.
IMPRESSION: 1.  No evidence of abdominal mass.
2.  Minimal amount of sludge within an otherwise normal-appearing
gallbladder.
3.  Sub centimeter anechoic lesion within the left kidney is too
small to accurately characterize but favored to represent a renal
cyst.

## 2014-04-24 NOTE — Progress Notes (Signed)
PT evaluation G-codes    12/18/13 1108  PT Time Calculation  PT Start Time 1048  PT Stop Time 1057  PT Time Calculation (min) 9 min  PT G-Codes **NOT FOR INPATIENT CLASS**  Functional Assessment Tool Used clinical judgement  Functional Limitation Mobility: Walking and moving around  Mobility: Walking and Moving Around Current Status (A4497) CI  Mobility: Walking and Moving Around Goal Status (N3005) CH  PT General Charges  $$ ACUTE PT VISIT 1 Procedure  PT Evaluation  $Initial PT Evaluation Tier I 1 Procedure  PT Treatments  $Gait Training 8-22 mins   Carmelia Bake, PT, DPT 04/24/2014 Pager: [phone]

## 2014-06-26 DIAGNOSIS — N184 Chronic kidney disease, stage 4 (severe): Secondary | ICD-10-CM | POA: Diagnosis not present

## 2014-06-26 DIAGNOSIS — E1129 Type 2 diabetes mellitus with other diabetic kidney complication: Secondary | ICD-10-CM | POA: Diagnosis not present

## 2014-06-26 DIAGNOSIS — I10 Essential (primary) hypertension: Secondary | ICD-10-CM | POA: Diagnosis not present

## 2014-06-26 DIAGNOSIS — R079 Chest pain, unspecified: Secondary | ICD-10-CM | POA: Diagnosis not present

## 2014-07-31 DIAGNOSIS — R300 Dysuria: Secondary | ICD-10-CM | POA: Diagnosis not present

## 2014-07-31 DIAGNOSIS — N390 Urinary tract infection, site not specified: Secondary | ICD-10-CM | POA: Diagnosis not present

## 2014-07-31 DIAGNOSIS — R319 Hematuria, unspecified: Secondary | ICD-10-CM | POA: Diagnosis not present

## 2014-08-15 DIAGNOSIS — E785 Hyperlipidemia, unspecified: Secondary | ICD-10-CM | POA: Diagnosis not present

## 2014-08-15 DIAGNOSIS — N184 Chronic kidney disease, stage 4 (severe): Secondary | ICD-10-CM | POA: Diagnosis not present

## 2014-08-15 DIAGNOSIS — I10 Essential (primary) hypertension: Secondary | ICD-10-CM | POA: Diagnosis not present

## 2014-08-15 DIAGNOSIS — E0822 Diabetes mellitus due to underlying condition with diabetic chronic kidney disease: Secondary | ICD-10-CM | POA: Diagnosis not present

## 2014-08-21 DIAGNOSIS — N189 Chronic kidney disease, unspecified: Secondary | ICD-10-CM | POA: Diagnosis not present

## 2014-08-21 DIAGNOSIS — N185 Chronic kidney disease, stage 5: Secondary | ICD-10-CM | POA: Diagnosis not present

## 2014-09-08 ENCOUNTER — Other Ambulatory Visit (HOSPITAL_COMMUNITY): Payer: Self-pay | Admitting: *Deleted

## 2014-09-11 ENCOUNTER — Encounter (HOSPITAL_COMMUNITY): Admission: RE | Admit: 2014-09-11 | Payer: Managed Care, Other (non HMO) | Source: Ambulatory Visit

## 2014-09-15 ENCOUNTER — Ambulatory Visit (HOSPITAL_COMMUNITY)
Admission: RE | Admit: 2014-09-15 | Discharge: 2014-09-15 | Disposition: A | Discharge status: Home / Self Care | Payer: Medicare Other | Source: Ambulatory Visit | Attending: Nephrology | Admitting: Nephrology

## 2014-09-15 DIAGNOSIS — Z79899 Other long term (current) drug therapy: Secondary | ICD-10-CM | POA: Diagnosis not present

## 2014-09-15 DIAGNOSIS — D631 Anemia in chronic kidney disease: Secondary | ICD-10-CM | POA: Diagnosis not present

## 2014-09-15 DIAGNOSIS — N185 Chronic kidney disease, stage 5: Secondary | ICD-10-CM | POA: Diagnosis not present

## 2014-09-15 DIAGNOSIS — Z5181 Encounter for therapeutic drug level monitoring: Secondary | ICD-10-CM | POA: Insufficient documentation

## 2014-09-15 LAB — CBC
HCT: 27.1 % — ABNORMAL LOW (ref 39.0–52.0)
Hemoglobin: 9.3 g/dL — ABNORMAL LOW (ref 13.0–17.0)
MCH: 30.8 pg (ref 26.0–34.0)
MCHC: 34.3 g/dL (ref 30.0–36.0)
MCV: 89.7 fL (ref 78.0–100.0)
Platelets: 168 10*3/uL (ref 150–400)
RBC: 3.02 MIL/uL — AB (ref 4.22–5.81)
RDW: 13.2 % (ref 11.5–15.5)
WBC: 4.3 10*3/uL (ref 4.0–10.5)

## 2014-09-15 MED ORDER — DARBEPOETIN ALFA 40 MCG/0.4ML IJ SOSY
PREFILLED_SYRINGE | INTRAMUSCULAR | Status: AC
  Filled 2014-09-15: qty 0.4

## 2014-09-15 MED ORDER — DARBEPOETIN ALFA 40 MCG/0.4ML IJ SOSY
40.0000 ug | PREFILLED_SYRINGE | INTRAMUSCULAR | Status: DC
  Administered 2014-09-15: 40 ug via SUBCUTANEOUS

## 2014-09-15 NOTE — Discharge Instructions (Signed)
Darbepoetin Alfa injection What is this medicine? DARBEPOETIN ALFA (dar be POE e tin AL fa) helps your body make more red blood cells. It is used to treat anemia caused by chronic kidney failure and chemotherapy. This medicine may be used for other purposes; ask your health care provider or pharmacist if you have questions. COMMON BRAND NAME(S): Aranesp What should I tell my health care provider before I take this medicine? They need to know if you have any of these conditions: -blood clotting disorders or history of blood clots -cancer patient not on chemotherapy -cystic fibrosis -heart disease, such as angina, heart failure, or a history of a heart attack -hemoglobin level of 12 g/dL or greater -high blood pressure -low levels of folate, iron, or vitamin B12 -seizures -an unusual or allergic reaction to darbepoetin, erythropoietin, albumin, hamster proteins, latex, other medicines, foods, dyes, or preservatives -pregnant or trying to get pregnant -breast-feeding How should I use this medicine? This medicine is for injection into a vein or under the skin. It is usually given by a health care professional in a hospital or clinic setting. If you get this medicine at home, you will be taught how to prepare and give this medicine. Do not shake the solution before you withdraw a dose. Use exactly as directed. Take your medicine at regular intervals. Do not take your medicine more often than directed. It is important that you put your used needles and syringes in a special sharps container. Do not put them in a trash can. If you do not have a sharps container, call your pharmacist or healthcare provider to get one. Talk to your pediatrician regarding the use of this medicine in children. While this medicine may be used in children as young as 1 year for selected conditions, precautions do apply. Overdosage: If you think you have taken too much of this medicine contact a poison control center or  emergency room at once. NOTE: This medicine is only for you. Do not share this medicine with others. What if I miss a dose? If you miss a dose, take it as soon as you can. If it is almost time for your next dose, take only that dose. Do not take double or extra doses. What may interact with this medicine? Do not take this medicine with any of the following medications: -epoetin alfa This list may not describe all possible interactions. Give your health care provider a list of all the medicines, herbs, non-prescription drugs, or dietary supplements you use. Also tell them if you smoke, drink alcohol, or use illegal drugs. Some items may interact with your medicine. What should I watch for while using this medicine? Visit your prescriber or health care professional for regular checks on your progress and for the needed blood tests and blood pressure measurements. It is especially important for the doctor to make sure your hemoglobin level is in the desired range, to limit the risk of potential side effects and to give you the best benefit. Keep all appointments for any recommended tests. Check your blood pressure as directed. Ask your doctor what your blood pressure should be and when you should contact him or her. As your body makes more red blood cells, you may need to take iron, folic acid, or vitamin B supplements. Ask your doctor or health care provider which products are right for you. If you have kidney disease continue dietary restrictions, even though this medication can make you feel better. Talk with your doctor or health   care professional about the foods you eat and the vitamins that you take. What side effects may I notice from receiving this medicine? Side effects that you should report to your doctor or health care professional as soon as possible: -allergic reactions like skin rash, itching or hives, swelling of the face, lips, or tongue -breathing problems -changes in vision -chest  pain -confusion, trouble speaking or understanding -feeling faint or lightheaded, falls -high blood pressure -muscle aches or pains -pain, swelling, warmth in the leg -rapid weight gain -severe headaches -sudden numbness or weakness of the face, arm or leg -trouble walking, dizziness, loss of balance or coordination -seizures (convulsions) -swelling of the ankles, feet, hands -unusually weak or tired Side effects that usually do not require medical attention (report to your doctor or health care professional if they continue or are bothersome): -diarrhea -fever, chills (flu-like symptoms) -headaches -nausea, vomiting -redness, stinging, or swelling at site where injected This list may not describe all possible side effects. Call your doctor for medical advice about side effects. You may report side effects to FDA at 1-800-FDA-1088. Where should I keep my medicine? Keep out of the reach of children. Store in a refrigerator between 2 and 8 degrees C (36 and 46 degrees F). Do not freeze. Do not shake. Throw away any unused portion if using a single-dose vial. Throw away any unused medicine after the expiration date. NOTE: This sheet is a summary. It may not cover all possible information. If you have questions about this medicine, talk to your doctor, pharmacist, or health care provider.  2015, [employer]. (2008-05-23 10:23:57)  

## 2014-09-25 DIAGNOSIS — R809 Proteinuria, unspecified: Secondary | ICD-10-CM | POA: Diagnosis not present

## 2014-09-25 DIAGNOSIS — I2510 Atherosclerotic heart disease of native coronary artery without angina pectoris: Secondary | ICD-10-CM | POA: Diagnosis not present

## 2014-09-25 DIAGNOSIS — N184 Chronic kidney disease, stage 4 (severe): Secondary | ICD-10-CM | POA: Diagnosis not present

## 2014-09-25 DIAGNOSIS — E1129 Type 2 diabetes mellitus with other diabetic kidney complication: Secondary | ICD-10-CM | POA: Diagnosis not present

## 2014-09-25 DIAGNOSIS — I10 Essential (primary) hypertension: Secondary | ICD-10-CM | POA: Diagnosis not present

## 2014-09-28 DIAGNOSIS — H905 Unspecified sensorineural hearing loss: Secondary | ICD-10-CM | POA: Diagnosis not present

## 2014-10-12 ENCOUNTER — Encounter (HOSPITAL_COMMUNITY)
Admission: RE | Admit: 2014-10-12 | Discharge: 2014-10-12 | Disposition: A | Discharge status: Home / Self Care | Payer: Medicare Other | Source: Ambulatory Visit | Attending: Nephrology | Admitting: Nephrology

## 2014-10-12 DIAGNOSIS — N185 Chronic kidney disease, stage 5: Secondary | ICD-10-CM | POA: Diagnosis not present

## 2014-10-12 DIAGNOSIS — Z5181 Encounter for therapeutic drug level monitoring: Secondary | ICD-10-CM | POA: Insufficient documentation

## 2014-10-12 DIAGNOSIS — Z79899 Other long term (current) drug therapy: Secondary | ICD-10-CM | POA: Diagnosis not present

## 2014-10-12 DIAGNOSIS — D631 Anemia in chronic kidney disease: Secondary | ICD-10-CM | POA: Insufficient documentation

## 2014-10-12 LAB — IRON AND TIBC
IRON: 117 ug/dL (ref 42–165)
Saturation Ratios: 37 % (ref 20–55)
TIBC: 313 ug/dL (ref 215–435)
UIBC: 196 ug/dL (ref 125–400)

## 2014-10-12 LAB — POCT HEMOGLOBIN-HEMACUE: Hemoglobin: 10.9 g/dL — ABNORMAL LOW (ref 13.0–17.0)

## 2014-10-12 LAB — FERRITIN: Ferritin: 67 ng/mL (ref 22–322)

## 2014-10-12 LAB — RENAL FUNCTION PANEL
Albumin: 4.2 g/dL (ref 3.5–5.2)
Anion gap: 12 (ref 5–15)
BUN: 78 mg/dL — ABNORMAL HIGH (ref 6–23)
CO2: 21 mmol/L (ref 19–32)
CREATININE: 5.18 mg/dL — AB (ref 0.50–1.35)
Calcium: 7.7 mg/dL — ABNORMAL LOW (ref 8.4–10.5)
Chloride: 105 mmol/L (ref 96–112)
GFR calc non Af Amer: 9 mL/min — ABNORMAL LOW (ref >90)
GFR, EST AFRICAN AMERICAN: 11 mL/min — AB (ref >90)
GLUCOSE: 259 mg/dL — AB (ref 70–99)
PHOSPHORUS: 5.4 mg/dL — AB (ref 2.3–4.6)
POTASSIUM: 5.5 mmol/L — AB (ref 3.5–5.1)
Sodium: 138 mmol/L (ref 135–145)

## 2014-10-12 MED ORDER — DARBEPOETIN ALFA 40 MCG/0.4ML IJ SOSY
40.0000 ug | PREFILLED_SYRINGE | INTRAMUSCULAR | Status: DC
  Administered 2014-10-12: 40 ug via SUBCUTANEOUS

## 2014-10-12 MED ORDER — DARBEPOETIN ALFA 40 MCG/0.4ML IJ SOSY
PREFILLED_SYRINGE | INTRAMUSCULAR | Status: AC
  Filled 2014-10-12: qty 0.4

## 2014-10-12 MED ORDER — DARBEPOETIN ALFA 40 MCG/0.4ML IJ SOSY: 40.0000 ug | PREFILLED_SYRINGE | INTRAMUSCULAR | Status: DC

## 2014-10-16 DIAGNOSIS — H905 Unspecified sensorineural hearing loss: Secondary | ICD-10-CM | POA: Diagnosis not present

## 2014-11-09 ENCOUNTER — Encounter (HOSPITAL_COMMUNITY)
Admission: RE | Admit: 2014-11-09 | Discharge: 2014-11-09 | Disposition: A | Discharge status: Home / Self Care | Payer: Medicare Other | Source: Ambulatory Visit | Attending: Nephrology | Admitting: Nephrology

## 2014-11-09 DIAGNOSIS — N185 Chronic kidney disease, stage 5: Secondary | ICD-10-CM | POA: Insufficient documentation

## 2014-11-09 DIAGNOSIS — Z79899 Other long term (current) drug therapy: Secondary | ICD-10-CM | POA: Diagnosis not present

## 2014-11-09 DIAGNOSIS — D631 Anemia in chronic kidney disease: Secondary | ICD-10-CM | POA: Insufficient documentation

## 2014-11-09 DIAGNOSIS — Z5181 Encounter for therapeutic drug level monitoring: Secondary | ICD-10-CM | POA: Insufficient documentation

## 2014-11-09 LAB — RENAL FUNCTION PANEL
ANION GAP: 12 (ref 5–15)
Albumin: 4 g/dL (ref 3.5–5.0)
BUN: 92 mg/dL — AB (ref 6–20)
CHLORIDE: 106 mmol/L (ref 101–111)
CO2: 22 mmol/L (ref 22–32)
CREATININE: 5.82 mg/dL — AB (ref 0.61–1.24)
Calcium: 7.7 mg/dL — ABNORMAL LOW (ref 8.9–10.3)
GFR calc Af Amer: 9 mL/min — ABNORMAL LOW (ref >60)
GFR calc non Af Amer: 8 mL/min — ABNORMAL LOW (ref >60)
GLUCOSE: 235 mg/dL — AB (ref 65–99)
POTASSIUM: 5 mmol/L (ref 3.5–5.1)
Phosphorus: 4.5 mg/dL (ref 2.5–4.6)
Sodium: 140 mmol/L (ref 135–145)

## 2014-11-09 LAB — CBC
HCT: 32.7 % — ABNORMAL LOW (ref 39.0–52.0)
HEMOGLOBIN: 11.5 g/dL — AB (ref 13.0–17.0)
MCH: 32.1 pg (ref 26.0–34.0)
MCHC: 35.2 g/dL (ref 30.0–36.0)
MCV: 91.3 fL (ref 78.0–100.0)
Platelets: 156 10*3/uL (ref 150–400)
RBC: 3.58 MIL/uL — ABNORMAL LOW (ref 4.22–5.81)
RDW: 12.5 % (ref 11.5–15.5)
WBC: 5.3 10*3/uL (ref 4.0–10.5)

## 2014-11-09 MED ORDER — DARBEPOETIN ALFA 40 MCG/0.4ML IJ SOSY
40.0000 ug | PREFILLED_SYRINGE | INTRAMUSCULAR | Status: DC
  Administered 2014-11-09: 40 ug via SUBCUTANEOUS

## 2014-11-09 MED ORDER — DARBEPOETIN ALFA 40 MCG/0.4ML IJ SOSY: 40.0000 ug | PREFILLED_SYRINGE | INTRAMUSCULAR | Status: DC

## 2014-11-09 MED ORDER — DARBEPOETIN ALFA 40 MCG/0.4ML IJ SOSY
PREFILLED_SYRINGE | INTRAMUSCULAR | Status: AC
  Filled 2014-11-09: qty 0.4

## 2014-11-21 DIAGNOSIS — N184 Chronic kidney disease, stage 4 (severe): Secondary | ICD-10-CM | POA: Diagnosis not present

## 2014-11-21 DIAGNOSIS — E1122 Type 2 diabetes mellitus with diabetic chronic kidney disease: Secondary | ICD-10-CM | POA: Diagnosis not present

## 2014-11-21 DIAGNOSIS — Z794 Long term (current) use of insulin: Secondary | ICD-10-CM | POA: Diagnosis not present

## 2014-11-21 DIAGNOSIS — E1165 Type 2 diabetes mellitus with hyperglycemia: Secondary | ICD-10-CM | POA: Diagnosis not present

## 2014-11-21 DIAGNOSIS — E785 Hyperlipidemia, unspecified: Secondary | ICD-10-CM | POA: Diagnosis not present

## 2014-11-21 DIAGNOSIS — I10 Essential (primary) hypertension: Secondary | ICD-10-CM | POA: Diagnosis not present

## 2014-11-24 DIAGNOSIS — N185 Chronic kidney disease, stage 5: Secondary | ICD-10-CM | POA: Diagnosis not present

## 2014-11-24 DIAGNOSIS — I10 Essential (primary) hypertension: Secondary | ICD-10-CM | POA: Diagnosis not present

## 2014-11-24 DIAGNOSIS — R809 Proteinuria, unspecified: Secondary | ICD-10-CM | POA: Diagnosis not present

## 2014-11-24 DIAGNOSIS — I2510 Atherosclerotic heart disease of native coronary artery without angina pectoris: Secondary | ICD-10-CM | POA: Diagnosis not present

## 2014-11-24 DIAGNOSIS — N189 Chronic kidney disease, unspecified: Secondary | ICD-10-CM | POA: Diagnosis not present

## 2014-11-24 DIAGNOSIS — N184 Chronic kidney disease, stage 4 (severe): Secondary | ICD-10-CM | POA: Diagnosis not present

## 2014-11-24 DIAGNOSIS — E1129 Type 2 diabetes mellitus with other diabetic kidney complication: Secondary | ICD-10-CM | POA: Diagnosis not present

## 2014-12-04 ENCOUNTER — Other Ambulatory Visit (HOSPITAL_COMMUNITY): Payer: Self-pay | Admitting: *Deleted

## 2014-12-07 ENCOUNTER — Inpatient Hospital Stay (HOSPITAL_COMMUNITY)
Admission: RE | Admit: 2014-12-07 | Discharge: 2014-12-07 | Disposition: A | Discharge status: Home / Self Care | Payer: Managed Care, Other (non HMO) | Source: Ambulatory Visit | Attending: Nephrology | Admitting: Nephrology

## 2015-01-11 ENCOUNTER — Encounter (HOSPITAL_COMMUNITY)
Admission: RE | Admit: 2015-01-11 | Discharge: 2015-01-11 | Disposition: A | Discharge status: Home / Self Care | Payer: Medicare Other | Source: Ambulatory Visit | Attending: Nephrology | Admitting: Nephrology

## 2015-01-11 DIAGNOSIS — Z5181 Encounter for therapeutic drug level monitoring: Secondary | ICD-10-CM | POA: Diagnosis not present

## 2015-01-11 DIAGNOSIS — N185 Chronic kidney disease, stage 5: Secondary | ICD-10-CM | POA: Insufficient documentation

## 2015-01-11 DIAGNOSIS — D631 Anemia in chronic kidney disease: Secondary | ICD-10-CM | POA: Diagnosis not present

## 2015-01-11 DIAGNOSIS — Z79899 Other long term (current) drug therapy: Secondary | ICD-10-CM | POA: Diagnosis not present

## 2015-01-11 LAB — RENAL FUNCTION PANEL
ALBUMIN: 4 g/dL (ref 3.5–5.0)
ANION GAP: 11 (ref 5–15)
BUN: 75 mg/dL — ABNORMAL HIGH (ref 6–20)
CALCIUM: 7.4 mg/dL — AB (ref 8.9–10.3)
CO2: 20 mmol/L — AB (ref 22–32)
CREATININE: 5.45 mg/dL — AB (ref 0.61–1.24)
Chloride: 108 mmol/L (ref 101–111)
GFR calc non Af Amer: 9 mL/min — ABNORMAL LOW (ref >60)
GFR, EST AFRICAN AMERICAN: 10 mL/min — AB (ref >60)
GLUCOSE: 71 mg/dL (ref 65–99)
PHOSPHORUS: 5.1 mg/dL — AB (ref 2.5–4.6)
Potassium: 5 mmol/L (ref 3.5–5.1)
Sodium: 139 mmol/L (ref 135–145)

## 2015-01-11 LAB — CBC
HEMATOCRIT: 29.9 % — AB (ref 39.0–52.0)
Hemoglobin: 10.3 g/dL — ABNORMAL LOW (ref 13.0–17.0)
MCH: 31.1 pg (ref 26.0–34.0)
MCHC: 34.4 g/dL (ref 30.0–36.0)
MCV: 90.3 fL (ref 78.0–100.0)
Platelets: 156 10*3/uL (ref 150–400)
RBC: 3.31 MIL/uL — ABNORMAL LOW (ref 4.22–5.81)
RDW: 12.6 % (ref 11.5–15.5)
WBC: 5.1 10*3/uL (ref 4.0–10.5)

## 2015-01-11 LAB — IRON AND TIBC
IRON: 109 ug/dL (ref 45–182)
Saturation Ratios: 38 % (ref 17.9–39.5)
TIBC: 287 ug/dL (ref 250–450)
UIBC: 178 ug/dL

## 2015-01-11 LAB — FERRITIN: Ferritin: 83 ng/mL (ref 24–336)

## 2015-01-11 MED ORDER — DARBEPOETIN ALFA 60 MCG/0.3ML IJ SOSY
PREFILLED_SYRINGE | INTRAMUSCULAR | Status: AC
  Filled 2015-01-11: qty 0.3

## 2015-01-11 MED ORDER — DARBEPOETIN ALFA 40 MCG/0.4ML IJ SOSY: 40.0000 ug | PREFILLED_SYRINGE | INTRAMUSCULAR | Status: DC

## 2015-01-11 MED ORDER — DARBEPOETIN ALFA 60 MCG/0.3ML IJ SOSY
60.0000 ug | PREFILLED_SYRINGE | Freq: Once | INTRAMUSCULAR | Status: AC
  Administered 2015-01-11: 60 ug via SUBCUTANEOUS

## 2015-01-25 DIAGNOSIS — N185 Chronic kidney disease, stage 5: Secondary | ICD-10-CM | POA: Diagnosis not present

## 2015-01-25 DIAGNOSIS — E119 Type 2 diabetes mellitus without complications: Secondary | ICD-10-CM | POA: Diagnosis not present

## 2015-01-25 DIAGNOSIS — I129 Hypertensive chronic kidney disease with stage 1 through stage 4 chronic kidney disease, or unspecified chronic kidney disease: Secondary | ICD-10-CM | POA: Diagnosis not present

## 2015-01-25 DIAGNOSIS — D508 Other iron deficiency anemias: Secondary | ICD-10-CM | POA: Diagnosis not present

## 2015-02-20 DIAGNOSIS — Z794 Long term (current) use of insulin: Secondary | ICD-10-CM | POA: Diagnosis not present

## 2015-02-20 DIAGNOSIS — N184 Chronic kidney disease, stage 4 (severe): Secondary | ICD-10-CM | POA: Diagnosis not present

## 2015-02-20 DIAGNOSIS — E1122 Type 2 diabetes mellitus with diabetic chronic kidney disease: Secondary | ICD-10-CM | POA: Diagnosis not present

## 2015-02-20 DIAGNOSIS — E785 Hyperlipidemia, unspecified: Secondary | ICD-10-CM | POA: Diagnosis not present

## 2015-02-20 DIAGNOSIS — I10 Essential (primary) hypertension: Secondary | ICD-10-CM | POA: Diagnosis not present

## 2015-02-21 ENCOUNTER — Other Ambulatory Visit (HOSPITAL_COMMUNITY): Payer: Self-pay | Admitting: *Deleted

## 2015-02-22 ENCOUNTER — Encounter (HOSPITAL_COMMUNITY)
Admission: RE | Admit: 2015-02-22 | Discharge: 2015-02-22 | Disposition: A | Discharge status: Home / Self Care | Payer: Medicare Other | Source: Ambulatory Visit | Attending: Nephrology | Admitting: Nephrology

## 2015-02-22 DIAGNOSIS — D631 Anemia in chronic kidney disease: Secondary | ICD-10-CM | POA: Insufficient documentation

## 2015-02-22 DIAGNOSIS — Z79899 Other long term (current) drug therapy: Secondary | ICD-10-CM | POA: Diagnosis not present

## 2015-02-22 DIAGNOSIS — N185 Chronic kidney disease, stage 5: Secondary | ICD-10-CM | POA: Insufficient documentation

## 2015-02-22 DIAGNOSIS — Z5181 Encounter for therapeutic drug level monitoring: Secondary | ICD-10-CM | POA: Insufficient documentation

## 2015-02-22 LAB — RENAL FUNCTION PANEL
ALBUMIN: 4.4 g/dL (ref 3.5–5.0)
Anion gap: 8 (ref 5–15)
BUN: 86 mg/dL — AB (ref 6–20)
CO2: 23 mmol/L (ref 22–32)
Calcium: 7.3 mg/dL — ABNORMAL LOW (ref 8.9–10.3)
Chloride: 108 mmol/L (ref 101–111)
Creatinine, Ser: 5.57 mg/dL — ABNORMAL HIGH (ref 0.61–1.24)
GFR, EST AFRICAN AMERICAN: 10 mL/min — AB (ref >60)
GFR, EST NON AFRICAN AMERICAN: 8 mL/min — AB (ref >60)
Glucose, Bld: 221 mg/dL — ABNORMAL HIGH (ref 65–99)
PHOSPHORUS: 5.2 mg/dL — AB (ref 2.5–4.6)
POTASSIUM: 5.2 mmol/L — AB (ref 3.5–5.1)
Sodium: 139 mmol/L (ref 135–145)

## 2015-02-22 LAB — IRON AND TIBC
Iron: 114 ug/dL (ref 45–182)
SATURATION RATIOS: 36 % (ref 17.9–39.5)
TIBC: 316 ug/dL (ref 250–450)
UIBC: 202 ug/dL

## 2015-02-22 LAB — FERRITIN: Ferritin: 83 ng/mL (ref 24–336)

## 2015-02-22 MED ORDER — DARBEPOETIN ALFA 40 MCG/0.4ML IJ SOSY: 60.0000 ug | PREFILLED_SYRINGE | INTRAMUSCULAR | Status: DC

## 2015-02-22 MED ORDER — DARBEPOETIN ALFA 60 MCG/0.3ML IJ SOSY
PREFILLED_SYRINGE | INTRAMUSCULAR | Status: AC
  Filled 2015-02-22: qty 0.3

## 2015-02-22 MED ORDER — DARBEPOETIN ALFA 60 MCG/0.3ML IJ SOSY
60.0000 ug | PREFILLED_SYRINGE | INTRAMUSCULAR | Status: DC
  Administered 2015-02-22: 60 ug via SUBCUTANEOUS

## 2015-02-23 LAB — POCT HEMOGLOBIN-HEMACUE: Hemoglobin: 11.3 g/dL — ABNORMAL LOW (ref 13.0–17.0)

## 2015-02-23 LAB — PTH, INTACT AND CALCIUM
CALCIUM TOTAL (PTH): 7.2 mg/dL — AB (ref 8.6–10.2)
PTH: 326 pg/mL — ABNORMAL HIGH (ref 15–65)

## 2015-02-27 DIAGNOSIS — Z0000 Encounter for general adult medical examination without abnormal findings: Secondary | ICD-10-CM | POA: Diagnosis not present

## 2015-03-19 DIAGNOSIS — I10 Essential (primary) hypertension: Secondary | ICD-10-CM | POA: Diagnosis not present

## 2015-03-19 DIAGNOSIS — D649 Anemia, unspecified: Secondary | ICD-10-CM | POA: Diagnosis not present

## 2015-03-19 DIAGNOSIS — Z23 Encounter for immunization: Secondary | ICD-10-CM | POA: Diagnosis not present

## 2015-03-19 DIAGNOSIS — E211 Secondary hyperparathyroidism, not elsewhere classified: Secondary | ICD-10-CM | POA: Diagnosis not present

## 2015-03-19 DIAGNOSIS — N185 Chronic kidney disease, stage 5: Secondary | ICD-10-CM | POA: Diagnosis not present

## 2015-03-19 DIAGNOSIS — R309 Painful micturition, unspecified: Secondary | ICD-10-CM | POA: Diagnosis not present

## 2015-03-19 DIAGNOSIS — E780 Pure hypercholesterolemia: Secondary | ICD-10-CM | POA: Diagnosis not present

## 2015-03-19 DIAGNOSIS — E119 Type 2 diabetes mellitus without complications: Secondary | ICD-10-CM | POA: Diagnosis not present

## 2015-03-19 DIAGNOSIS — N189 Chronic kidney disease, unspecified: Secondary | ICD-10-CM | POA: Diagnosis not present

## 2015-03-27 DIAGNOSIS — N184 Chronic kidney disease, stage 4 (severe): Secondary | ICD-10-CM | POA: Diagnosis not present

## 2015-03-27 DIAGNOSIS — I2510 Atherosclerotic heart disease of native coronary artery without angina pectoris: Secondary | ICD-10-CM | POA: Diagnosis not present

## 2015-03-27 DIAGNOSIS — I10 Essential (primary) hypertension: Secondary | ICD-10-CM | POA: Diagnosis not present

## 2015-03-27 DIAGNOSIS — R0789 Other chest pain: Secondary | ICD-10-CM | POA: Diagnosis not present

## 2015-04-05 ENCOUNTER — Encounter (HOSPITAL_COMMUNITY)
Admission: RE | Admit: 2015-04-05 | Discharge: 2015-04-05 | Disposition: A | Discharge status: Home / Self Care | Payer: Medicare Other | Source: Ambulatory Visit | Attending: Nephrology | Admitting: Nephrology

## 2015-04-05 DIAGNOSIS — D631 Anemia in chronic kidney disease: Secondary | ICD-10-CM | POA: Diagnosis not present

## 2015-04-05 DIAGNOSIS — N185 Chronic kidney disease, stage 5: Secondary | ICD-10-CM | POA: Insufficient documentation

## 2015-04-05 DIAGNOSIS — Z79899 Other long term (current) drug therapy: Secondary | ICD-10-CM | POA: Insufficient documentation

## 2015-04-05 DIAGNOSIS — Z5181 Encounter for therapeutic drug level monitoring: Secondary | ICD-10-CM | POA: Insufficient documentation

## 2015-04-05 LAB — RENAL FUNCTION PANEL
ALBUMIN: 4.2 g/dL (ref 3.5–5.0)
Anion gap: 9 (ref 5–15)
BUN: 82 mg/dL — AB (ref 6–20)
CO2: 23 mmol/L (ref 22–32)
CREATININE: 5.39 mg/dL — AB (ref 0.61–1.24)
Calcium: 8.4 mg/dL — ABNORMAL LOW (ref 8.9–10.3)
Chloride: 108 mmol/L (ref 101–111)
GFR calc Af Amer: 10 mL/min — ABNORMAL LOW (ref >60)
GFR, EST NON AFRICAN AMERICAN: 9 mL/min — AB (ref >60)
Glucose, Bld: 148 mg/dL — ABNORMAL HIGH (ref 65–99)
PHOSPHORUS: 4.6 mg/dL (ref 2.5–4.6)
Potassium: 5.1 mmol/L (ref 3.5–5.1)
Sodium: 140 mmol/L (ref 135–145)

## 2015-04-05 LAB — IRON AND TIBC
Iron: 105 ug/dL (ref 45–182)
SATURATION RATIOS: 33 % (ref 17.9–39.5)
TIBC: 318 ug/dL (ref 250–450)
UIBC: 213 ug/dL

## 2015-04-05 LAB — FERRITIN: Ferritin: 91 ng/mL (ref 24–336)

## 2015-04-05 LAB — POCT HEMOGLOBIN-HEMACUE: HEMOGLOBIN: 11.4 g/dL — AB (ref 13.0–17.0)

## 2015-04-05 MED ORDER — DARBEPOETIN ALFA 60 MCG/0.3ML IJ SOSY
60.0000 ug | PREFILLED_SYRINGE | INTRAMUSCULAR | Status: DC
  Administered 2015-04-05: 60 ug via SUBCUTANEOUS

## 2015-04-05 MED ORDER — DARBEPOETIN ALFA 60 MCG/0.3ML IJ SOSY
PREFILLED_SYRINGE | INTRAMUSCULAR | Status: AC
  Filled 2015-04-05: qty 0.3

## 2015-04-05 MED ORDER — DARBEPOETIN ALFA 40 MCG/0.4ML IJ SOSY: 60.0000 ug | PREFILLED_SYRINGE | INTRAMUSCULAR | Status: DC

## 2015-04-06 LAB — PTH, INTACT AND CALCIUM
Calcium, Total (PTH): 8.1 mg/dL — ABNORMAL LOW (ref 8.6–10.2)
PTH: 277 pg/mL — AB (ref 15–65)

## 2015-05-22 DIAGNOSIS — I10 Essential (primary) hypertension: Secondary | ICD-10-CM | POA: Diagnosis not present

## 2015-05-22 DIAGNOSIS — N185 Chronic kidney disease, stage 5: Secondary | ICD-10-CM | POA: Diagnosis not present

## 2015-05-22 DIAGNOSIS — E1122 Type 2 diabetes mellitus with diabetic chronic kidney disease: Secondary | ICD-10-CM | POA: Diagnosis not present

## 2015-05-22 DIAGNOSIS — N184 Chronic kidney disease, stage 4 (severe): Secondary | ICD-10-CM | POA: Diagnosis not present

## 2015-05-23 ENCOUNTER — Encounter (HOSPITAL_COMMUNITY)
Admission: RE | Admit: 2015-05-23 | Discharge: 2015-05-23 | Disposition: A | Discharge status: Home / Self Care | Payer: Medicare Other | Source: Ambulatory Visit | Attending: Nephrology | Admitting: Nephrology

## 2015-05-23 DIAGNOSIS — Z5181 Encounter for therapeutic drug level monitoring: Secondary | ICD-10-CM | POA: Diagnosis not present

## 2015-05-23 DIAGNOSIS — N185 Chronic kidney disease, stage 5: Secondary | ICD-10-CM | POA: Insufficient documentation

## 2015-05-23 DIAGNOSIS — D631 Anemia in chronic kidney disease: Secondary | ICD-10-CM | POA: Insufficient documentation

## 2015-05-23 DIAGNOSIS — Z79899 Other long term (current) drug therapy: Secondary | ICD-10-CM | POA: Insufficient documentation

## 2015-05-23 LAB — RENAL FUNCTION PANEL
ALBUMIN: 4 g/dL (ref 3.5–5.0)
ANION GAP: 10 (ref 5–15)
BUN: 74 mg/dL — ABNORMAL HIGH (ref 6–20)
CALCIUM: 8.2 mg/dL — AB (ref 8.9–10.3)
CO2: 21 mmol/L — AB (ref 22–32)
Chloride: 109 mmol/L (ref 101–111)
Creatinine, Ser: 5.34 mg/dL — ABNORMAL HIGH (ref 0.61–1.24)
GFR, EST AFRICAN AMERICAN: 10 mL/min — AB (ref >60)
GFR, EST NON AFRICAN AMERICAN: 9 mL/min — AB (ref >60)
Glucose, Bld: 167 mg/dL — ABNORMAL HIGH (ref 65–99)
PHOSPHORUS: 5 mg/dL — AB (ref 2.5–4.6)
Potassium: 5.7 mmol/L — ABNORMAL HIGH (ref 3.5–5.1)
SODIUM: 140 mmol/L (ref 135–145)

## 2015-05-23 LAB — IRON AND TIBC
IRON: 91 ug/dL (ref 45–182)
Saturation Ratios: 32 % (ref 17.9–39.5)
TIBC: 288 ug/dL (ref 250–450)
UIBC: 197 ug/dL

## 2015-05-23 LAB — FERRITIN: Ferritin: 79 ng/mL (ref 24–336)

## 2015-05-23 MED ORDER — DARBEPOETIN ALFA 60 MCG/0.3ML IJ SOSY
PREFILLED_SYRINGE | INTRAMUSCULAR | Status: AC
  Filled 2015-05-23: qty 0.3

## 2015-05-23 MED ORDER — DARBEPOETIN ALFA 60 MCG/0.3ML IJ SOSY
60.0000 ug | PREFILLED_SYRINGE | INTRAMUSCULAR | Status: DC
  Administered 2015-05-23: 60 ug via SUBCUTANEOUS

## 2015-05-23 MED ORDER — DARBEPOETIN ALFA 40 MCG/0.4ML IJ SOSY: 60.0000 ug | PREFILLED_SYRINGE | INTRAMUSCULAR | Status: DC

## 2015-05-24 LAB — PTH, INTACT AND CALCIUM
CALCIUM TOTAL (PTH): 8.1 mg/dL — AB (ref 8.6–10.2)
PTH: 239 pg/mL — ABNORMAL HIGH (ref 15–65)

## 2015-05-24 LAB — POCT HEMOGLOBIN-HEMACUE: Hemoglobin: 11.8 g/dL — ABNORMAL LOW (ref 13.0–17.0)

## 2015-05-28 DIAGNOSIS — E1165 Type 2 diabetes mellitus with hyperglycemia: Secondary | ICD-10-CM | POA: Diagnosis not present

## 2015-05-28 DIAGNOSIS — E785 Hyperlipidemia, unspecified: Secondary | ICD-10-CM | POA: Diagnosis not present

## 2015-05-28 DIAGNOSIS — N184 Chronic kidney disease, stage 4 (severe): Secondary | ICD-10-CM | POA: Diagnosis not present

## 2015-05-28 DIAGNOSIS — E1122 Type 2 diabetes mellitus with diabetic chronic kidney disease: Secondary | ICD-10-CM | POA: Diagnosis not present

## 2015-05-28 DIAGNOSIS — Z794 Long term (current) use of insulin: Secondary | ICD-10-CM | POA: Diagnosis not present

## 2015-06-09 DIAGNOSIS — Z9115 Patient's noncompliance with renal dialysis: Secondary | ICD-10-CM | POA: Diagnosis not present

## 2015-06-09 DIAGNOSIS — N186 End stage renal disease: Secondary | ICD-10-CM | POA: Diagnosis not present

## 2015-06-09 DIAGNOSIS — E875 Hyperkalemia: Secondary | ICD-10-CM | POA: Diagnosis not present

## 2015-06-09 DIAGNOSIS — Z794 Long term (current) use of insulin: Secondary | ICD-10-CM | POA: Diagnosis not present

## 2015-06-09 DIAGNOSIS — R319 Hematuria, unspecified: Secondary | ICD-10-CM | POA: Diagnosis not present

## 2015-06-09 DIAGNOSIS — Z9049 Acquired absence of other specified parts of digestive tract: Secondary | ICD-10-CM | POA: Diagnosis not present

## 2015-06-09 DIAGNOSIS — I209 Angina pectoris, unspecified: Secondary | ICD-10-CM | POA: Diagnosis not present

## 2015-06-09 DIAGNOSIS — Z87891 Personal history of nicotine dependence: Secondary | ICD-10-CM | POA: Diagnosis not present

## 2015-06-09 DIAGNOSIS — Z79899 Other long term (current) drug therapy: Secondary | ICD-10-CM | POA: Diagnosis not present

## 2015-06-09 DIAGNOSIS — I129 Hypertensive chronic kidney disease with stage 1 through stage 4 chronic kidney disease, or unspecified chronic kidney disease: Secondary | ICD-10-CM | POA: Diagnosis not present

## 2015-06-09 DIAGNOSIS — R9082 White matter disease, unspecified: Secondary | ICD-10-CM | POA: Diagnosis not present

## 2015-06-09 DIAGNOSIS — Z7982 Long term (current) use of aspirin: Secondary | ICD-10-CM | POA: Diagnosis not present

## 2015-06-09 DIAGNOSIS — N189 Chronic kidney disease, unspecified: Secondary | ICD-10-CM | POA: Diagnosis not present

## 2015-06-09 DIAGNOSIS — H81399 Other peripheral vertigo, unspecified ear: Secondary | ICD-10-CM | POA: Diagnosis not present

## 2015-06-09 DIAGNOSIS — R42 Dizziness and giddiness: Secondary | ICD-10-CM | POA: Diagnosis not present

## 2015-06-09 DIAGNOSIS — Z7902 Long term (current) use of antithrombotics/antiplatelets: Secondary | ICD-10-CM | POA: Diagnosis not present

## 2015-06-09 DIAGNOSIS — I120 Hypertensive chronic kidney disease with stage 5 chronic kidney disease or end stage renal disease: Secondary | ICD-10-CM | POA: Diagnosis not present

## 2015-06-09 DIAGNOSIS — G319 Degenerative disease of nervous system, unspecified: Secondary | ICD-10-CM | POA: Diagnosis not present

## 2015-06-09 DIAGNOSIS — E1122 Type 2 diabetes mellitus with diabetic chronic kidney disease: Secondary | ICD-10-CM | POA: Diagnosis not present

## 2015-06-13 DIAGNOSIS — I10 Essential (primary) hypertension: Secondary | ICD-10-CM | POA: Diagnosis not present

## 2015-06-13 DIAGNOSIS — N185 Chronic kidney disease, stage 5: Secondary | ICD-10-CM | POA: Diagnosis not present

## 2015-06-13 DIAGNOSIS — E039 Hypothyroidism, unspecified: Secondary | ICD-10-CM | POA: Diagnosis not present

## 2015-06-13 DIAGNOSIS — E211 Secondary hyperparathyroidism, not elsewhere classified: Secondary | ICD-10-CM | POA: Diagnosis not present

## 2015-06-13 DIAGNOSIS — E119 Type 2 diabetes mellitus without complications: Secondary | ICD-10-CM | POA: Diagnosis not present

## 2015-06-13 DIAGNOSIS — D649 Anemia, unspecified: Secondary | ICD-10-CM | POA: Diagnosis not present

## 2015-07-04 ENCOUNTER — Inpatient Hospital Stay (HOSPITAL_COMMUNITY): Admission: RE | Admit: 2015-07-04 | Payer: Managed Care, Other (non HMO) | Source: Ambulatory Visit

## 2017-07-24 DEATH — deceased

## 2019-08-23 DIAGNOSIS — C7951 Secondary malignant neoplasm of bone: Secondary | ICD-10-CM
